# Patient Record
Sex: Male | Born: 1960 | Race: White | Hispanic: No | Marital: Married | Smoking: Current some day smoker
Health system: Southern US, Community
[De-identification: ages and names within clinical notes are randomized; demographics above are authoritative.]

## PROBLEM LIST (undated history)

## (undated) DIAGNOSIS — H5210 Myopia, unspecified eye: Secondary | ICD-10-CM

## (undated) DIAGNOSIS — H43811 Vitreous degeneration, right eye: Secondary | ICD-10-CM

## (undated) DIAGNOSIS — E669 Obesity, unspecified: Secondary | ICD-10-CM

## (undated) DIAGNOSIS — E785 Hyperlipidemia, unspecified: Secondary | ICD-10-CM

## (undated) DIAGNOSIS — N529 Male erectile dysfunction, unspecified: Secondary | ICD-10-CM

## (undated) DIAGNOSIS — K760 Fatty (change of) liver, not elsewhere classified: Secondary | ICD-10-CM

## (undated) DIAGNOSIS — M1990 Unspecified osteoarthritis, unspecified site: Secondary | ICD-10-CM

## (undated) DIAGNOSIS — H21261 Iris atrophy (essential) (progressive), right eye: Secondary | ICD-10-CM

## (undated) HISTORY — DX: Hyperlipidemia, unspecified: E78.5

## (undated) HISTORY — DX: Vitreous degeneration, right eye: H43.811

## (undated) HISTORY — PX: EYE SURGERY: SHX253

## (undated) HISTORY — DX: Gilbert syndrome: E80.4

## (undated) HISTORY — DX: Obesity, unspecified: E66.9

## (undated) HISTORY — DX: Male erectile dysfunction, unspecified: N52.9

## (undated) HISTORY — DX: Myopia, unspecified eye: H52.10

## (undated) HISTORY — PX: CARDIAC CATHETERIZATION: SHX172

## (undated) HISTORY — DX: Iris atrophy (essential) (progressive), right eye: H21.261

---

## 2006-06-16 ENCOUNTER — Ambulatory Visit: Payer: Self-pay

## 2010-11-02 HISTORY — PX: COLONOSCOPY W/ POLYPECTOMY: SHX1380

## 2013-09-19 ENCOUNTER — Ambulatory Visit: Payer: Self-pay | Admitting: Family Medicine

## 2014-08-08 ENCOUNTER — Ambulatory Visit: Payer: Self-pay | Admitting: Family Medicine

## 2014-09-07 ENCOUNTER — Ambulatory Visit: Payer: Self-pay | Admitting: Family Medicine

## 2014-10-15 ENCOUNTER — Ambulatory Visit: Payer: Self-pay | Admitting: Pain Medicine

## 2014-10-31 ENCOUNTER — Ambulatory Visit: Payer: Self-pay | Admitting: Pain Medicine

## 2014-11-27 ENCOUNTER — Ambulatory Visit: Payer: Self-pay | Admitting: Pain Medicine

## 2014-12-10 ENCOUNTER — Ambulatory Visit: Payer: Self-pay | Admitting: Pain Medicine

## 2015-04-24 ENCOUNTER — Other Ambulatory Visit: Payer: Self-pay | Admitting: Family Medicine

## 2015-05-21 ENCOUNTER — Ambulatory Visit: Payer: Self-pay | Admitting: Family Medicine

## 2015-06-04 DIAGNOSIS — E785 Hyperlipidemia, unspecified: Secondary | ICD-10-CM | POA: Insufficient documentation

## 2015-06-12 ENCOUNTER — Ambulatory Visit: Payer: Self-pay | Admitting: Family Medicine

## 2015-07-22 ENCOUNTER — Encounter: Payer: Self-pay | Admitting: Emergency Medicine

## 2015-07-22 ENCOUNTER — Emergency Department
Admission: EM | Admit: 2015-07-22 | Discharge: 2015-07-22 | Disposition: A | Discharge status: Home / Self Care | Payer: Self-pay | Attending: Emergency Medicine | Admitting: Emergency Medicine

## 2015-07-22 DIAGNOSIS — Z7982 Long term (current) use of aspirin: Secondary | ICD-10-CM | POA: Insufficient documentation

## 2015-07-22 DIAGNOSIS — Z87891 Personal history of nicotine dependence: Secondary | ICD-10-CM | POA: Insufficient documentation

## 2015-07-22 DIAGNOSIS — Z79899 Other long term (current) drug therapy: Secondary | ICD-10-CM | POA: Insufficient documentation

## 2015-07-22 DIAGNOSIS — Y9389 Activity, other specified: Secondary | ICD-10-CM | POA: Insufficient documentation

## 2015-07-22 DIAGNOSIS — Y92410 Unspecified street and highway as the place of occurrence of the external cause: Secondary | ICD-10-CM | POA: Insufficient documentation

## 2015-07-22 DIAGNOSIS — Y998 Other external cause status: Secondary | ICD-10-CM | POA: Insufficient documentation

## 2015-07-22 DIAGNOSIS — S46812A Strain of other muscles, fascia and tendons at shoulder and upper arm level, left arm, initial encounter: Secondary | ICD-10-CM | POA: Insufficient documentation

## 2015-07-22 MED ORDER — NAPROXEN 500 MG PO TABS: 500.0000 mg | ORAL_TABLET | Freq: Two times a day (BID) | ORAL | Status: DC

## 2015-07-22 NOTE — Discharge Instructions (Signed)
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
## 2015-09-08 ENCOUNTER — Other Ambulatory Visit: Payer: Self-pay | Admitting: Family Medicine

## 2015-09-09 NOTE — Telephone Encounter (Signed)
Apt pe 

## 2015-09-10 NOTE — Telephone Encounter (Signed)
LM for pt to call and schedule appt. °

## 2015-09-24 ENCOUNTER — Telehealth: Payer: Self-pay | Admitting: Vascular Surgery

## 2015-09-24 NOTE — Telephone Encounter (Signed)
Spoke with pt, he was very hesitant to schedule for 12/.21/16 because he did not have any time at work to take. However, I think I convinced him to schedule- he is supposed to call me back to confirm,d pm

## 2015-09-24 NOTE — Telephone Encounter (Signed)
-----   Message from Denman George, RN sent at 09/24/2015 12:47 PM EST ----- Regarding: needs consult with [facility] Contact: [phone] Please schedule for consult with Dr. Scot Dock prior to ALIF of 10/25/15 (Dr. Scot Dock is sched. to assist Dr. Hal Neer)  Please remind pt. to bring copy of LS spine film to appt.

## 2015-09-30 ENCOUNTER — Other Ambulatory Visit (HOSPITAL_COMMUNITY): Payer: Self-pay | Admitting: Neurosurgery

## 2015-10-02 ENCOUNTER — Other Ambulatory Visit: Payer: Self-pay

## 2015-10-03 ENCOUNTER — Other Ambulatory Visit: Payer: Self-pay

## 2015-10-16 ENCOUNTER — Telehealth: Payer: Self-pay

## 2015-10-16 MED ORDER — TRAZODONE HCL 50 MG PO TABS: ORAL_TABLET | ORAL | Status: DC

## 2015-10-16 NOTE — Telephone Encounter (Signed)
[facility] Refill request for 90 day supply on the following: Trazodone 50mg  1 tablet at bedtime as needed

## 2015-10-18 ENCOUNTER — Encounter: Payer: Self-pay | Admitting: Vascular Surgery

## 2015-10-23 ENCOUNTER — Encounter: Payer: Self-pay | Admitting: Vascular Surgery

## 2015-10-23 ENCOUNTER — Other Ambulatory Visit: Payer: Self-pay | Admitting: Family Medicine

## 2015-10-24 NOTE — Telephone Encounter (Signed)
Apt pe 

## 2015-11-18 ENCOUNTER — Encounter: Payer: Self-pay | Admitting: Family Medicine

## 2015-12-19 ENCOUNTER — Encounter (HOSPITAL_COMMUNITY): Admission: RE | Payer: Self-pay | Source: Ambulatory Visit

## 2015-12-19 ENCOUNTER — Inpatient Hospital Stay (HOSPITAL_COMMUNITY)
Admission: RE | Admit: 2015-12-19 | Payer: No Typology Code available for payment source | Source: Ambulatory Visit | Admitting: Neurosurgery

## 2015-12-19 SURGERY — ANTERIOR LUMBAR FUSION 1 LEVEL
Anesthesia: General

## 2016-01-19 ENCOUNTER — Other Ambulatory Visit: Payer: Self-pay | Admitting: Family Medicine

## 2016-01-22 ENCOUNTER — Telehealth: Payer: Self-pay | Admitting: Family Medicine

## 2016-01-22 MED ORDER — TRAZODONE HCL 50 MG PO TABS: ORAL_TABLET | ORAL | Status: DC

## 2016-01-22 MED ORDER — MELOXICAM 15 MG PO TABS: 15.0000 mg | ORAL_TABLET | Freq: Every day | ORAL | Status: DC

## 2016-01-22 MED ORDER — ATORVASTATIN CALCIUM 10 MG PO TABS: 10.0000 mg | ORAL_TABLET | Freq: Every day | ORAL | Status: DC

## 2016-01-22 NOTE — Telephone Encounter (Signed)
Rxs sent to his pharmacy.  

## 2016-01-22 NOTE — Telephone Encounter (Signed)
Pt called and scheduled a CPE for 03/03/16, Dr. Rance Muir first available, pt will needs enough refills on all medication to last until his CPE appt 03/03/16 @ 2pm. Thanks.

## 2016-03-03 ENCOUNTER — Other Ambulatory Visit: Payer: Self-pay | Admitting: Family Medicine

## 2016-03-03 ENCOUNTER — Ambulatory Visit (INDEPENDENT_AMBULATORY_CARE_PROVIDER_SITE_OTHER): Payer: BLUE CROSS/BLUE SHIELD | Admitting: Family Medicine

## 2016-03-03 ENCOUNTER — Encounter: Payer: Self-pay | Admitting: Family Medicine

## 2016-03-03 VITALS — BP 119/78 | HR 60 | Temp 97.8°F | Ht 70.0 in | Wt 255.0 lb

## 2016-03-03 DIAGNOSIS — R5382 Chronic fatigue, unspecified: Secondary | ICD-10-CM

## 2016-03-03 DIAGNOSIS — Z23 Encounter for immunization: Secondary | ICD-10-CM

## 2016-03-03 DIAGNOSIS — E785 Hyperlipidemia, unspecified: Secondary | ICD-10-CM

## 2016-03-03 DIAGNOSIS — M51369 Other intervertebral disc degeneration, lumbar region without mention of lumbar back pain or lower extremity pain: Secondary | ICD-10-CM | POA: Insufficient documentation

## 2016-03-03 DIAGNOSIS — Z0000 Encounter for general adult medical examination without abnormal findings: Secondary | ICD-10-CM | POA: Diagnosis not present

## 2016-03-03 DIAGNOSIS — M5136 Other intervertebral disc degeneration, lumbar region: Secondary | ICD-10-CM | POA: Diagnosis not present

## 2016-03-03 LAB — URINALYSIS, ROUTINE W REFLEX MICROSCOPIC
BILIRUBIN UA: NEGATIVE
Glucose, UA: NEGATIVE
Ketones, UA: NEGATIVE
LEUKOCYTES UA: NEGATIVE
Nitrite, UA: NEGATIVE
PH UA: 5 (ref 5.0–7.5)
Protein, UA: NEGATIVE
Specific Gravity, UA: 1.015 (ref 1.005–1.030)
Urobilinogen, Ur: 0.2 mg/dL (ref 0.2–1.0)

## 2016-03-03 LAB — MICROSCOPIC EXAMINATION
BACTERIA UA: NONE SEEN
EPITHELIAL CELLS (NON RENAL): NONE SEEN /HPF (ref 0–10)
RBC, UA: NONE SEEN /hpf (ref 0–?)
WBC, UA: NONE SEEN /hpf (ref 0–?)

## 2016-03-03 MED ORDER — ATORVASTATIN CALCIUM 10 MG PO TABS: 10.0000 mg | ORAL_TABLET | Freq: Every day | ORAL | Status: DC

## 2016-03-03 MED ORDER — MELOXICAM 15 MG PO TABS: 15.0000 mg | ORAL_TABLET | Freq: Every day | ORAL | Status: DC

## 2016-03-03 MED ORDER — TRAZODONE HCL 50 MG PO TABS: ORAL_TABLET | ORAL | Status: DC

## 2016-03-03 NOTE — Assessment & Plan Note (Signed)
The current medical regimen is effective;  continue present plan and medications.  

## 2016-03-03 NOTE — Progress Notes (Signed)
BP 119/78 mmHg  Pulse 60  Temp(Src) 97.8 F (36.6 C)  Ht 5\' 10"  (1.778 m)  Wt 255 lb (115.667 kg)  BMI 36.59 kg/m2  SpO2 95%   Subjective:    Patient ID: Willie Williams, male    DOB: 05-04-1961, 55 y.o.   MRN: [id number]  HPI: Willie Williams is a 55 y.o. male  Chief Complaint  Patient presents with  . Annual Exam  Patient doing well no complaints from medication No complaints neck stable what helped his back most his been physical therapy Eyes have  been stable.  with some fatigue issues concerned about testosterone will check that today Sleep trazodone seems to help sleep okay A more difficult sleep schedule Has also arthritis complaints especially left hand elbow shoulder has occasional meloxicam which seems to help  Relevant past medical, surgical, family and social history reviewed and updated as indicated. Interim medical history since our last visit reviewed. Allergies and medications reviewed and updated.  Review of Systems  Constitutional: Negative.   HENT: Negative.   Eyes: Negative.   Respiratory: Negative.   Cardiovascular: Negative.   Gastrointestinal: Negative.   Endocrine: Negative.   Genitourinary: Negative.   Musculoskeletal: Negative.   Skin: Negative.   Allergic/Immunologic: Negative.   Neurological: Negative.   Hematological: Negative.   Psychiatric/Behavioral: Negative.     Per HPI unless specifically indicated above     Objective:    BP 119/78 mmHg  Pulse 60  Temp(Src) 97.8 F (36.6 C)  Ht 5\' 10"  (1.778 m)  Wt 255 lb (115.667 kg)  BMI 36.59 kg/m2  SpO2 95%  Wt Readings from Last 3 Encounters:  03/03/16 255 lb (115.667 kg)  07/22/15 241 lb (109.317 kg)  11/13/14 244 lb (110.678 kg)    Physical Exam  Constitutional: He is oriented to person, place, and time. He appears well-developed and well-nourished.  HENT:  Head: Normocephalic and atraumatic.  Right Ear: External ear normal.  Left Ear: External ear normal.  Eyes:  Conjunctivae and EOM are normal. Pupils are equal, round, and reactive to light.  Neck: Normal range of motion. Neck supple.  Cardiovascular: Normal rate, regular rhythm, normal heart sounds and intact distal pulses.   Pulmonary/Chest: Effort normal and breath sounds normal.  Abdominal: Soft. Bowel sounds are normal. There is no splenomegaly or hepatomegaly.  Genitourinary: Rectum normal, prostate normal and penis normal.  Musculoskeletal: Normal range of motion.  Neurological: He is alert and oriented to person, place, and time. He has normal reflexes.  Skin: No rash noted. No erythema.  Psychiatric: He has a normal mood and affect. His behavior is normal. Judgment and thought content normal.    Results for orders placed or performed in visit on 03/03/16  Microscopic Examination  Result Value Ref Range   WBC, UA None seen 0 -  5 /hpf   RBC, UA None seen 0 -  2 /hpf   Epithelial Cells (non renal) None seen 0 - 10 /hpf   Bacteria, UA None seen None seen/Few  Urinalysis, Routine w reflex microscopic (not at [facility])  Result Value Ref Range   Specific Gravity, UA 1.015 1.005 - 1.030   pH, UA 5.0 5.0 - 7.5   Color, UA Yellow Yellow   Appearance Ur Clear Clear   Leukocytes, UA Negative Negative   Protein, UA Negative Negative/Trace   Glucose, UA Negative Negative   Ketones, UA Negative Negative   RBC, UA Trace (A) Negative   Bilirubin, UA  Negative Negative   Urobilinogen, Ur 0.2 0.2 - 1.0 mg/dL   Nitrite, UA Negative Negative   Microscopic Examination See below:       Assessment & Plan:   Problem List Items Addressed This Visit    None    Visit Diagnoses    Routine general medical examination at a health care facility    -  Primary    Relevant Orders    CBC with Differential/Platelet    Comprehensive metabolic panel    Lipid Panel w/o Chol/HDL Ratio    PSA    TSH    Urinalysis, Routine w reflex microscopic (not at [facility]) (Completed)    Healthcare maintenance        Relevant  Orders    Hepatitis C Antibody    Immunization due        Relevant Orders    Tdap vaccine greater than or equal to 7yo IM (Completed)        Follow up plan: No Follow-up on file.

## 2016-03-03 NOTE — Assessment & Plan Note (Signed)
Stable

## 2016-03-04 ENCOUNTER — Telehealth: Payer: Self-pay | Admitting: Family Medicine

## 2016-03-04 DIAGNOSIS — R7989 Other specified abnormal findings of blood chemistry: Secondary | ICD-10-CM

## 2016-03-04 LAB — CBC WITH DIFFERENTIAL/PLATELET
BASOS ABS: 0.1 10*3/uL (ref 0.0–0.2)
BASOS: 1 %
EOS (ABSOLUTE): 0.2 10*3/uL (ref 0.0–0.4)
Eos: 2 %
Hematocrit: 46.1 % (ref 37.5–51.0)
Hemoglobin: 15.5 g/dL (ref 12.6–17.7)
IMMATURE GRANS (ABS): 0 10*3/uL (ref 0.0–0.1)
IMMATURE GRANULOCYTES: 0 %
Lymphocytes Absolute: 3.6 10*3/uL — ABNORMAL HIGH (ref 0.7–3.1)
Lymphs: 51 %
MCH: 30.1 pg (ref 26.6–33.0)
MCHC: 33.6 g/dL (ref 31.5–35.7)
MCV: 90 fL (ref 79–97)
MONOS ABS: 0.6 10*3/uL (ref 0.1–0.9)
Monocytes: 9 %
Neutrophils Absolute: 2.6 10*3/uL (ref 1.4–7.0)
Neutrophils: 37 %
PLATELETS: 191 10*3/uL (ref 150–379)
RBC: 5.15 x10E6/uL (ref 4.14–5.80)
RDW: 13.8 % (ref 12.3–15.4)
WBC: 7.1 10*3/uL (ref 3.4–10.8)

## 2016-03-04 LAB — COMPREHENSIVE METABOLIC PANEL
A/G RATIO: 2.2 (ref 1.2–2.2)
ALT: 45 IU/L — AB (ref 0–44)
AST: 28 IU/L (ref 0–40)
Albumin: 4.6 g/dL (ref 3.5–5.5)
Alkaline Phosphatase: 56 IU/L (ref 39–117)
BUN/Creatinine Ratio: 13 (ref 9–20)
BUN: 12 mg/dL (ref 6–24)
Bilirubin Total: 0.9 mg/dL (ref 0.0–1.2)
CALCIUM: 9.3 mg/dL (ref 8.7–10.2)
CHLORIDE: 100 mmol/L (ref 96–106)
CO2: 21 mmol/L (ref 18–29)
Creatinine, Ser: 0.91 mg/dL (ref 0.76–1.27)
GFR, EST AFRICAN AMERICAN: 109 mL/min/{1.73_m2} (ref >59)
GFR, EST NON AFRICAN AMERICAN: 95 mL/min/{1.73_m2} (ref >59)
GLUCOSE: 83 mg/dL (ref 65–99)
Globulin, Total: 2.1 g/dL (ref 1.5–4.5)
POTASSIUM: 4.3 mmol/L (ref 3.5–5.2)
Sodium: 138 mmol/L (ref 134–144)
TOTAL PROTEIN: 6.7 g/dL (ref 6.0–8.5)

## 2016-03-04 LAB — TESTOSTERONE: Testosterone: 286 ng/dL — ABNORMAL LOW (ref 348–1197)

## 2016-03-04 LAB — LIPID PANEL W/O CHOL/HDL RATIO
Cholesterol, Total: 180 mg/dL (ref 100–199)
HDL: 29 mg/dL — AB (ref >39)
LDL Calculated: 76 mg/dL (ref 0–99)
TRIGLYCERIDES: 373 mg/dL — AB (ref 0–149)
VLDL CHOLESTEROL CAL: 75 mg/dL — AB (ref 5–40)

## 2016-03-04 LAB — PSA: PROSTATE SPECIFIC AG, SERUM: 0.7 ng/mL (ref 0.0–4.0)

## 2016-03-04 LAB — TSH: TSH: 3.56 u[IU]/mL (ref 0.450–4.500)

## 2016-03-04 LAB — HEPATITIS C ANTIBODY: Hep C Virus Ab: <0.1 s/co ratio (ref 0.0–0.9)

## 2016-03-04 NOTE — Telephone Encounter (Signed)
Phone call Discussed with patient low testosterone. Discuss future workup and possible options if testosterone is in fact low. We will recheck testosterone

## 2016-07-27 DIAGNOSIS — S59902A Unspecified injury of left elbow, initial encounter: Secondary | ICD-10-CM | POA: Diagnosis not present

## 2016-07-27 DIAGNOSIS — S53402A Unspecified sprain of left elbow, initial encounter: Secondary | ICD-10-CM | POA: Diagnosis not present

## 2016-09-07 ENCOUNTER — Encounter: Payer: Self-pay | Admitting: Family Medicine

## 2016-09-07 ENCOUNTER — Other Ambulatory Visit: Payer: Self-pay | Admitting: Family Medicine

## 2016-09-07 ENCOUNTER — Ambulatory Visit (INDEPENDENT_AMBULATORY_CARE_PROVIDER_SITE_OTHER): Payer: BLUE CROSS/BLUE SHIELD | Admitting: Family Medicine

## 2016-09-07 VITALS — BP 145/91 | HR 67 | Temp 97.7°F | Ht 69.6 in | Wt 253.9 lb

## 2016-09-07 DIAGNOSIS — S46212A Strain of muscle, fascia and tendon of other parts of biceps, left arm, initial encounter: Secondary | ICD-10-CM | POA: Insufficient documentation

## 2016-09-07 DIAGNOSIS — L989 Disorder of the skin and subcutaneous tissue, unspecified: Secondary | ICD-10-CM

## 2016-09-07 DIAGNOSIS — Z1211 Encounter for screening for malignant neoplasm of colon: Secondary | ICD-10-CM | POA: Diagnosis not present

## 2016-09-07 DIAGNOSIS — E782 Mixed hyperlipidemia: Secondary | ICD-10-CM | POA: Diagnosis not present

## 2016-09-07 DIAGNOSIS — L859 Epidermal thickening, unspecified: Secondary | ICD-10-CM | POA: Diagnosis not present

## 2016-09-07 DIAGNOSIS — Z23 Encounter for immunization: Secondary | ICD-10-CM

## 2016-09-07 LAB — LP+ALT+AST PICCOLO, WAIVED
ALT (SGPT) PICCOLO, WAIVED: 34 U/L (ref 10–47)
AST (SGOT) PICCOLO, WAIVED: 33 U/L (ref 11–38)
CHOL/HDL RATIO PICCOLO,WAIVE: 3.5 mg/dL
CHOLESTEROL PICCOLO, WAIVED: 131 mg/dL (ref <200)
HDL Chol Piccolo, Waived: 38 mg/dL — ABNORMAL LOW (ref >59)
LDL Chol Calc Piccolo Waived: 52 mg/dL (ref <100)
TRIGLYCERIDES PICCOLO,WAIVED: 206 mg/dL — AB (ref <150)
VLDL Chol Calc Piccolo,Waive: 41 mg/dL — ABNORMAL HIGH (ref <30)

## 2016-09-07 NOTE — Addendum Note (Signed)
Addended byGolden Pop on: 09/07/2016 03:09 PM   Modules accepted: Orders

## 2016-09-07 NOTE — Assessment & Plan Note (Signed)
Discussed partial rupture left biceps tendon patient not ready to go to orthopedics yet discuss possible partial healing but will have chronic weakness of the left biceps. His biceps are not Job critical.

## 2016-09-07 NOTE — Assessment & Plan Note (Signed)
Shave biopsy performed see physical for details

## 2016-09-07 NOTE — Assessment & Plan Note (Signed)
The current medical regimen is effective;  continue present plan and medications.  

## 2016-09-07 NOTE — Patient Instructions (Addendum)
Influenza (Flu) Vaccine (Inactivated or Recombinant):  1. Why get vaccinated? Influenza ("flu") is a contagious disease that spreads around the [place] every year, usually between October and May. Flu is caused by influenza viruses, and is spread mainly by coughing, sneezing, and close contact. Anyone can get flu. Flu strikes suddenly and can last several days. Symptoms vary by age, but can include:  fever/chills  sore throat  muscle aches  fatigue  cough  headache  runny or stuffy nose Flu can also lead to pneumonia and blood infections, and cause diarrhea and seizures in children. If you have a medical condition, such as heart or lung disease, flu can make it worse. Flu is more dangerous for some people. Infants and young children, people 65 years of age and older, pregnant women, and people with certain health conditions or a weakened immune system are at greatest risk. Each year thousands of people in the [place] die from flu, and many more are hospitalized. Flu vaccine can:  keep you from getting flu,  make flu less severe if you do get it, and  keep you from spreading flu to your family and other people. 2. Inactivated and recombinant flu vaccines A dose of flu vaccine is recommended every flu season. Children 6 months through 8 years of age may need two doses during the same flu season. Everyone else needs only one dose each flu season. Some inactivated flu vaccines contain a very small amount of a mercury-based preservative called thimerosal. Studies have not shown thimerosal in vaccines to be harmful, but flu vaccines that do not contain thimerosal are available. There is no live flu virus in flu shots. They cannot cause the flu. There are many flu viruses, and they are always changing. Each year a new flu vaccine is made to protect against three or four viruses that are likely to cause disease in the upcoming flu season. But even when the vaccine doesn't exactly  match these viruses, it may still provide some protection. Flu vaccine cannot prevent:  flu that is caused by a virus not covered by the vaccine, or  illnesses that look like flu but are not. It takes about 2 weeks for protection to develop after vaccination, and protection lasts through the flu season. 3. Some people should not get this vaccine Tell the person who is giving you the vaccine:  If you have any severe, life-threatening allergies. If you ever had a life-threatening allergic reaction after a dose of flu vaccine, or have a severe allergy to any part of this vaccine, you may be advised not to get vaccinated. Most, but not all, types of flu vaccine contain a small amount of egg protein.  If you ever had Guillain-Barre Syndrome (also called GBS). Some people with a history of GBS should not get this vaccine. This should be discussed with your doctor.  If you are not feeling well. It is usually okay to get flu vaccine when you have a mild illness, but you might be asked to come back when you feel better. 4. Risks of a vaccine reaction With any medicine, including vaccines, there is a chance of reactions. These are usually mild and go away on their own, but serious reactions are also possible. Most people who get a flu shot do not have any problems with it. Minor problems following a flu shot include:  soreness, redness, or swelling where the shot was given  hoarseness  sore, red or itchy eyes  cough    fever  aches  headache  itching  fatigue If these problems occur, they usually begin soon after the shot and last 1 or 2 days. More serious problems following a flu shot can include the following:  There may be a small increased risk of Guillain-Barre Syndrome (GBS) after inactivated flu vaccine. This risk has been estimated at 1 or 2 additional cases per million people vaccinated. This is much lower than the risk of severe complications from flu, which can be prevented by  flu vaccine.  Young children who get the flu shot along with pneumococcal vaccine (PCV13) and/or DTaP vaccine at the same time might be slightly more likely to have a seizure caused by fever. Ask your doctor for more information. Tell your doctor if a child who is getting flu vaccine has ever had a seizure. Problems that could happen after any injected vaccine:  People sometimes faint after a medical procedure, including vaccination. Sitting or lying down for about 15 minutes can help prevent fainting, and injuries caused by a fall. Tell your doctor if you feel dizzy, or have vision changes or ringing in the ears.  Some people get severe pain in the shoulder and have difficulty moving the arm where a shot was given. This happens very rarely.  Any medication can cause a severe allergic reaction. Such reactions from a vaccine are very rare, estimated at about 1 in a million doses, and would happen within a few minutes to a few hours after the vaccination. As with any medicine, there is a very remote chance of a vaccine causing a serious injury or death. The safety of vaccines is always being monitored. For more information, visit: www.cdc.gov/vaccinesafety/ 5. What if there is a serious reaction? What should I look for?  Look for anything that concerns you, such as signs of a severe allergic reaction, very high fever, or unusual behavior. Signs of a severe allergic reaction can include hives, swelling of the face and throat, difficulty breathing, a fast heartbeat, dizziness, and weakness. These would start a few minutes to a few hours after the vaccination. What should I do?  If you think it is a severe allergic reaction or other emergency that can't wait, call 9-1-1 and get the person to the nearest hospital. Otherwise, call your doctor.  Reactions should be reported to the Vaccine Adverse Event Reporting System (VAERS). Your doctor should file this report, or you can do it yourself through the  VAERS web site at www.vaers.hhs.gov, or by calling 1-800-822-7967. VAERS does not give medical advice. 6. The National Vaccine Injury Compensation Program The National Vaccine Injury Compensation Program (VICP) is a federal program that was created to compensate people who may have been injured by certain vaccines. Persons who believe they may have been injured by a vaccine can learn about the program and about filing a claim by calling 1-800-338-2382 or visiting the VICP website at www.hrsa.gov/vaccinecompensation. There is a time limit to file a claim for compensation. 7. How can I learn more?  Ask your healthcare provider. He or she can give you the vaccine package insert or suggest other sources of information.  Call your local or state health department.  Contact the Centers for Disease Control and Prevention (CDC):  Call 1-800-232-4636 (1-800-CDC-INFO) or  Visit CDC's website at www.cdc.gov/flu Vaccine Information Statement Inactivated Influenza Vaccine (06/08/2014)   This information is not intended to replace advice given to you by your health care provider. Make sure you discuss any questions you have with   your health care provider.   Document Released: 08/13/2006 Document Revised: 11/09/2014 Document Reviewed: 06/11/2014 Elsevier Interactive Patient Education 2016 Elsevier Inc. Influenza (Flu) Vaccine (Inactivated or Recombinant):  1. Why get vaccinated? Influenza ("flu") is a contagious disease that spreads around the [place] every year, usually between October and May. Flu is caused by influenza viruses, and is spread mainly by coughing, sneezing, and close contact. Anyone can get flu. Flu strikes suddenly and can last several days. Symptoms vary by age, but can include:  fever/chills  sore throat  muscle aches  fatigue  cough  headache  runny or stuffy nose Flu can also lead to pneumonia and blood infections, and cause diarrhea and seizures in children. If  you have a medical condition, such as heart or lung disease, flu can make it worse. Flu is more dangerous for some people. Infants and young children, people 56 years of age and older, pregnant women, and people with certain health conditions or a weakened immune system are at greatest risk. Each year thousands of people in the [place] die from flu, and many more are hospitalized. Flu vaccine can:  keep you from getting flu,  make flu less severe if you do get it, and  keep you from spreading flu to your family and other people. 2. Inactivated and recombinant flu vaccines A dose of flu vaccine is recommended every flu season. Children 6 months through 34 years of age may need two doses during the same flu season. Everyone else needs only one dose each flu season. Some inactivated flu vaccines contain a very small amount of a mercury-based preservative called thimerosal. Studies have not shown thimerosal in vaccines to be harmful, but flu vaccines that do not contain thimerosal are available. There is no live flu virus in flu shots. They cannot cause the flu. There are many flu viruses, and they are always changing. Each year a new flu vaccine is made to protect against three or four viruses that are likely to cause disease in the upcoming flu season. But even when the vaccine doesn't exactly match these viruses, it may still provide some protection. Flu vaccine cannot prevent:  flu that is caused by a virus not covered by the vaccine, or  illnesses that look like flu but are not. It takes about 2 weeks for protection to develop after vaccination, and protection lasts through the flu season. 3. Some people should not get this vaccine Tell the person who is giving you the vaccine:  If you have any severe, life-threatening allergies. If you ever had a life-threatening allergic reaction after a dose of flu vaccine, or have a severe allergy to any part of this vaccine, you may be advised not  to get vaccinated. Most, but not all, types of flu vaccine contain a small amount of egg protein.  If you ever had Guillain-Barre Syndrome (also called GBS). Some people with a history of GBS should not get this vaccine. This should be discussed with your doctor.  If you are not feeling well. It is usually okay to get flu vaccine when you have a mild illness, but you might be asked to come back when you feel better. 4. Risks of a vaccine reaction With any medicine, including vaccines, there is a chance of reactions. These are usually mild and go away on their own, but serious reactions are also possible. Most people who get a flu shot do not have any problems with it. Minor problems following a flu  shot include:  soreness, redness, or swelling where the shot was given  hoarseness  sore, red or itchy eyes  cough  fever  aches  headache  itching  fatigue If these problems occur, they usually begin soon after the shot and last 1 or 2 days. More serious problems following a flu shot can include the following:  There may be a small increased risk of Guillain-Barre Syndrome (GBS) after inactivated flu vaccine. This risk has been estimated at 1 or 2 additional cases per million people vaccinated. This is much lower than the risk of severe complications from flu, which can be prevented by flu vaccine.  Young children who get the flu shot along with pneumococcal vaccine (PCV13) and/or DTaP vaccine at the same time might be slightly more likely to have a seizure caused by fever. Ask your doctor for more information. Tell your doctor if a child who is getting flu vaccine has ever had a seizure. Problems that could happen after any injected vaccine:  People sometimes faint after a medical procedure, including vaccination. Sitting or lying down for about 15 minutes can help prevent fainting, and injuries caused by a fall. Tell your doctor if you feel dizzy, or have vision changes or ringing in  the ears.  Some people get severe pain in the shoulder and have difficulty moving the arm where a shot was given. This happens very rarely.  Any medication can cause a severe allergic reaction. Such reactions from a vaccine are very rare, estimated at about 1 in a million doses, and would happen within a few minutes to a few hours after the vaccination. As with any medicine, there is a very remote chance of a vaccine causing a serious injury or death. The safety of vaccines is always being monitored. For more information, visit: http://www.aguilar.org/ 5. What if there is a serious reaction? What should I look for?  Look for anything that concerns you, such as signs of a severe allergic reaction, very high fever, or unusual behavior. Signs of a severe allergic reaction can include hives, swelling of the face and throat, difficulty breathing, a fast heartbeat, dizziness, and weakness. These would start a few minutes to a few hours after the vaccination. What should I do?  If you think it is a severe allergic reaction or other emergency that can't wait, call 9-1-1 and get the person to the nearest hospital. Otherwise, call your doctor.  Reactions should be reported to the Vaccine Adverse Event Reporting System (VAERS). Your doctor should file this report, or you can do it yourself through the VAERS web site at www.vaers.SamedayNews.es, or by calling (289) 449-0081. VAERS does not give medical advice. 6. The National Vaccine Injury Compensation Program The [employer] Vaccine Injury Compensation Program (VICP) is a federal program that was created to compensate people who may have been injured by certain vaccines. Persons who believe they may have been injured by a vaccine can learn about the program and about filing a claim by calling 581 044 4848 or visiting the Miguel [employer]rera website at GoldCloset.com.ee. There is a time limit to file a claim for compensation. 7. How can I learn more?  Ask  your healthcare provider. He or she can give you the vaccine package insert or suggest other sources of information.  Call your local or state health department.  Contact the Centers for Disease Control and Prevention (CDC):  Call (534)664-8146 (1-800-CDC-INFO) or  Visit CDC's website at https://gibson.com/ Vaccine Information Statement Inactivated Influenza Vaccine (06/08/2014)   This information  is not intended to replace advice given to you by your health care provider. Make sure you discuss any questions you have with your health care provider.   Document Released: 08/13/2006 Document Revised: 11/09/2014 Document Reviewed: 06/11/2014 Elsevier Interactive Patient Education [employer].

## 2016-09-07 NOTE — Progress Notes (Signed)
BP (!) 145/91 (BP Location: Left Arm, Patient Position: Sitting, Cuff Size: Large)   Pulse 67   Temp 97.7 F (36.5 C)   Ht 5' 9.6" (1.768 m)   Wt 253 lb 14.4 oz (115.2 kg)   SpO2 96%   BMI 36.85 kg/m    Subjective:    Patient ID: Willie Williams, male    DOB: Oct 03, 1961, 55 y.o.   MRN: [id number]  HPI: Willie Williams is a 55 y.o. male  Chief Complaint  Patient presents with  . Hyperlipidemia  With several concerns taking Lipitor with no problems taking faithfully Has nonhealing skin lesion right temple's been present pretty much all year will scratching bleed occasionally and is not healing.  On September 19 patient was picking up the front end of the boat and felt the biceps tendon pop with sudden onset of pain and no bruising had really limited use of his hands has noticed his biceps muscle has changed shape. Continues to be painful with use of his biceps Relevant past medical, surgical, family and social history reviewed and updated as indicated. Interim medical history since our last visit reviewed. Allergies and medications reviewed and updated.  Review of Systems  Constitutional: Negative.   Respiratory: Negative.   Cardiovascular: Negative.     Per HPI unless specifically indicated above     Objective:    BP (!) 145/91 (BP Location: Left Arm, Patient Position: Sitting, Cuff Size: Large)   Pulse 67   Temp 97.7 F (36.5 C)   Ht 5' 9.6" (1.768 m)   Wt 253 lb 14.4 oz (115.2 kg)   SpO2 96%   BMI 36.85 kg/m   Wt Readings from Last 3 Encounters:  09/07/16 253 lb 14.4 oz (115.2 kg)  03/03/16 255 lb (115.7 kg)  07/22/15 241 lb (109.3 kg)    Physical Exam  Constitutional: He is oriented to person, place, and time. He appears well-developed and well-nourished. No distress.  HENT:  Head: Normocephalic and atraumatic.  Right Ear: Hearing normal.  Left Ear: Hearing normal.  Nose: Nose normal.  Eyes: Conjunctivae and lids are normal. Right eye exhibits no  discharge. Left eye exhibits no discharge. No scleral icterus.  Cardiovascular: Normal rate, regular rhythm and normal heart sounds.   Pulmonary/Chest: Effort normal and breath sounds normal. No respiratory distress.  Musculoskeletal: Normal range of motion.  Left biceps distal insertion medially with pain and largely absent with bunching up of lateral and medial head of biceps with flexion. No pain with supination.   Neurological: He is alert and oriented to person, place, and time.  Skin: Skin is intact. No rash noted.  Lesion on right temple 4 mm prepped with Betadine and alcohol and infiltrated with Xylocaine with epinephrine shave biopsy performed lesion sent for pathology base destroyed with Surgitron unit. Patient tolerated procedure well. Patient education given on wound care.  Psychiatric: He has a normal mood and affect. His speech is normal and behavior is normal. Judgment and thought content normal. Cognition and memory are normal.    Results for orders placed or performed in visit on 09/07/16  LP+ALT+AST Piccolo, [employer]  Result Value Ref Range   ALT (SGPT) Piccolo, Waived 34 10 - 47 U/L   AST (SGOT) Piccolo, Waived 33 11 - 38 U/L   Cholesterol Piccolo, Waived 131 <200 mg/dL   HDL Chol Piccolo, Waived 38 (L) >59 mg/dL   Triglycerides Piccolo,Waived 206 (H) <150 mg/dL   Chol/HDL Ratio Piccolo,Waive 3.5 mg/dL  LDL Chol Calc Piccolo Waived 52 <100 mg/dL   VLDL Chol Calc Piccolo,Waive 41 (H) <30 mg/dL      Assessment & Plan:   Problem List Items Addressed This Visit      Musculoskeletal and Integument   Biceps tendon rupture, left, initial encounter    Discussed partial rupture left biceps tendon patient not ready to go to orthopedics yet discuss possible partial healing but will have chronic weakness of the left biceps. His biceps are not Job critical.       Skin lesion of face    Shave biopsy performed see physical for details        Other   Hyperlipidemia -  Primary    The current medical regimen is effective;  continue present plan and medications.       Relevant Orders   LP+ALT+AST Piccolo, [employer] (Completed)   Basic metabolic panel   Gilbert's disease   Relevant Orders   LP+ALT+AST Piccolo, [employer] (Completed)   Basic metabolic panel    Other Visit Diagnoses    Immunization due       Relevant Orders   Flu Vaccine QUAD 36+ mos PF IM (Fluarix & Fluzone Quad PF) (Completed)   Screening for colon cancer       Relevant Orders   Ambulatory referral to General Surgery       Follow up plan: Return for Physical Exam.

## 2016-09-08 ENCOUNTER — Encounter: Payer: Self-pay | Admitting: Family Medicine

## 2016-09-08 LAB — BASIC METABOLIC PANEL
BUN / CREAT RATIO: 11 (ref 9–20)
BUN: 11 mg/dL (ref 6–24)
CO2: 22 mmol/L (ref 18–29)
Calcium: 9.1 mg/dL (ref 8.7–10.2)
Chloride: 105 mmol/L (ref 96–106)
Creatinine, Ser: 1 mg/dL (ref 0.76–1.27)
GFR calc Af Amer: 97 mL/min/{1.73_m2} (ref >59)
GFR, EST NON AFRICAN AMERICAN: 84 mL/min/{1.73_m2} (ref >59)
GLUCOSE: 93 mg/dL (ref 65–99)
POTASSIUM: 4.1 mmol/L (ref 3.5–5.2)
SODIUM: 143 mmol/L (ref 134–144)

## 2016-09-09 LAB — PATHOLOGY

## 2016-09-10 ENCOUNTER — Ambulatory Visit: Payer: BLUE CROSS/BLUE SHIELD | Admitting: Family Medicine

## 2016-09-16 ENCOUNTER — Telehealth: Payer: Self-pay

## 2016-09-16 ENCOUNTER — Other Ambulatory Visit: Payer: Self-pay

## 2016-09-16 NOTE — Telephone Encounter (Signed)
Gastroenterology Pre-Procedure Review  Request Date: 10/16/2016 Requesting Physician: Dr. Jeananne Rama  PATIENT REVIEW QUESTIONS: The patient responded to the following health history questions as indicated:    1. Are you having any GI issues? no 2. Do you have a personal history of Polyps? yes (benign ) 3. Do you have a family history of Colon Cancer or Polyps? no 4. Diabetes Mellitus? no 5. Joint replacements in the past 12 months?no 6. Major health problems in the past 3 months?no 7. Any artificial heart valves, MVP, or defibrillator?no    MEDICATIONS & ALLERGIES:    Patient reports the following regarding taking any anticoagulation/antiplatelet therapy:   Plavix, Coumadin, Eliquis, Xarelto, Lovenox, Pradaxa, Brilinta, or Effient? no Aspirin? yes (Heart [facility])  Patient confirms/reports the following medications:  Current Outpatient Prescriptions  Medication Sig Dispense Refill  . aspirin EC 325 MG tablet Take 325 mg by mouth daily.    Marland Kitchen atorvastatin (LIPITOR) 10 MG tablet Take 1 tablet (10 mg total) by mouth at bedtime. 90 tablet 4  . meloxicam (MOBIC) 15 MG tablet Take 1 tablet (15 mg total) by mouth daily. 90 tablet 3  . Multiple Vitamins tablet Take by mouth.    . Omega-3 Fatty Acids (FISH OIL BURP-LESS PO) Take by mouth.    . traZODone (DESYREL) 50 MG tablet 1 TABLET AT BEDTIME AS NEEDED ORAL FOR SLEEP 90 tablet 4   No current facility-administered medications for this visit.     Patient confirms/reports the following allergies:  No Known Allergies  No orders of the defined types were placed in this encounter.   AUTHORIZATION INFORMATION Primary Insurance: 1D#: Group #:  Secondary Insurance: 1D#: Group #:  SCHEDULE INFORMATION: Date: 10/16/2016 Time: Location: [facility]

## 2016-09-16 NOTE — Telephone Encounter (Signed)
Screening Colonoscopy Z12.11 [facility] 10/16/2016 Dr. Alvina Filbert Pre cert is not required

## 2016-10-16 ENCOUNTER — Ambulatory Visit: Payer: BLUE CROSS/BLUE SHIELD | Admitting: Certified Registered Nurse Anesthetist

## 2016-10-16 ENCOUNTER — Encounter: Payer: Self-pay | Admitting: *Deleted

## 2016-10-16 ENCOUNTER — Encounter
Admission: RE | Disposition: A | Discharge status: Home / Self Care | Payer: Self-pay | Source: Ambulatory Visit | Attending: Gastroenterology

## 2016-10-16 ENCOUNTER — Ambulatory Visit
Admission: RE | Admit: 2016-10-16 | Discharge: 2016-10-16 | Disposition: A | Discharge status: Home / Self Care | Payer: BLUE CROSS/BLUE SHIELD | Source: Ambulatory Visit | Attending: Gastroenterology | Admitting: Gastroenterology

## 2016-10-16 DIAGNOSIS — K640 First degree hemorrhoids: Secondary | ICD-10-CM | POA: Insufficient documentation

## 2016-10-16 DIAGNOSIS — D123 Benign neoplasm of transverse colon: Secondary | ICD-10-CM | POA: Diagnosis not present

## 2016-10-16 DIAGNOSIS — D122 Benign neoplasm of ascending colon: Secondary | ICD-10-CM | POA: Diagnosis not present

## 2016-10-16 DIAGNOSIS — Z7982 Long term (current) use of aspirin: Secondary | ICD-10-CM | POA: Diagnosis not present

## 2016-10-16 DIAGNOSIS — E785 Hyperlipidemia, unspecified: Secondary | ICD-10-CM | POA: Insufficient documentation

## 2016-10-16 DIAGNOSIS — Z86010 Personal history of colonic polyps: Secondary | ICD-10-CM | POA: Insufficient documentation

## 2016-10-16 DIAGNOSIS — F17290 Nicotine dependence, other tobacco product, uncomplicated: Secondary | ICD-10-CM | POA: Diagnosis not present

## 2016-10-16 DIAGNOSIS — Z79899 Other long term (current) drug therapy: Secondary | ICD-10-CM | POA: Insufficient documentation

## 2016-10-16 DIAGNOSIS — K635 Polyp of colon: Secondary | ICD-10-CM | POA: Diagnosis not present

## 2016-10-16 DIAGNOSIS — Z1211 Encounter for screening for malignant neoplasm of colon: Secondary | ICD-10-CM | POA: Insufficient documentation

## 2016-10-16 DIAGNOSIS — K648 Other hemorrhoids: Secondary | ICD-10-CM | POA: Diagnosis not present

## 2016-10-16 DIAGNOSIS — E669 Obesity, unspecified: Secondary | ICD-10-CM | POA: Diagnosis not present

## 2016-10-16 HISTORY — PX: COLONOSCOPY WITH PROPOFOL: SHX5780

## 2016-10-16 SURGERY — COLONOSCOPY WITH PROPOFOL
Anesthesia: General

## 2016-10-16 MED ORDER — PROPOFOL 10 MG/ML IV BOLUS
INTRAVENOUS | Status: DC | PRN
  Administered 2016-10-16 (×2): 40 mg via INTRAVENOUS
  Administered 2016-10-16: 20 mg via INTRAVENOUS

## 2016-10-16 MED ORDER — LIDOCAINE HCL (CARDIAC) 20 MG/ML IV SOLN
INTRAVENOUS | Status: DC | PRN
  Administered 2016-10-16: 30 mg via INTRAVENOUS

## 2016-10-16 MED ORDER — MIDAZOLAM HCL 2 MG/2ML IJ SOLN
INTRAMUSCULAR | Status: DC | PRN
  Administered 2016-10-16: 1 mg via INTRAVENOUS

## 2016-10-16 MED ORDER — PROPOFOL 500 MG/50ML IV EMUL
INTRAVENOUS | Status: DC | PRN
Start: 2016-10-16 — End: 2016-10-16
  Administered 2016-10-16: 160 ug/kg/min via INTRAVENOUS

## 2016-10-16 MED ORDER — SODIUM CHLORIDE 0.9 % IV SOLN
INTRAVENOUS | Status: DC
  Administered 2016-10-16: 1000 mL via INTRAVENOUS

## 2016-10-16 NOTE — H&P (Signed)
  Jonathon Bellows MD [address] [place], [place] [address] Phone: [phone] Fax : [phone]  Primary Care Physician:  Golden Pop, MD Primary Gastroenterologist:  Dr. Jonathon Bellows   Pre-Procedure History & Physical: HPI:  Willie Williams is a 55 y.o. male is here for an colonoscopy.   Past Medical History:  Diagnosis Date  . ED (erectile dysfunction)   . Gilbert's disease   . Hyperlipidemia   . Myopia   . Obesity   . Progressive iris atrophy of right eye   . Vitreous degeneration of right eye     Past Surgical History:  Procedure Laterality Date  . CARDIAC CATHETERIZATION    . COLONOSCOPY W/ POLYPECTOMY  2012    Prior to Admission medications   Medication Sig Start Date End Date Taking? Authorizing Provider  aspirin EC 325 MG tablet Take 325 mg by mouth daily.    Historical Provider, MD  atorvastatin (LIPITOR) 10 MG tablet Take 1 tablet (10 mg total) by mouth at bedtime. 03/03/16   Guadalupe Maple, MD  meloxicam (MOBIC) 15 MG tablet Take 1 tablet (15 mg total) by mouth daily. 03/03/16   Guadalupe Maple, MD  Multiple Vitamins tablet Take by mouth.    Historical Provider, MD  Omega-3 Fatty Acids (FISH OIL BURP-LESS PO) Take by mouth.    Historical Provider, MD  traZODone (DESYREL) 50 MG tablet 1 TABLET AT BEDTIME AS NEEDED ORAL FOR SLEEP 03/03/16   Guadalupe Maple, MD    Allergies as of 09/16/2016  . (No Known Allergies)    Family History  Problem Relation Age of Onset  . Cancer Mother   . Cancer Father   . Heart disease Father     Social History   Social History  . Marital status: Married    Spouse name: N/A  . Number of children: N/A  . Years of education: N/A   Occupational History  . Not on file.   Social History Main Topics  . Smoking status: Current Some Day Smoker    Types: Cigars  . Smokeless tobacco: Current User    Types: Snuff  . Alcohol use No  . Drug use: No  . Sexual activity: Not on file   Other Topics Concern  . Not on file     Social History Narrative  . No narrative on file    Review of Systems: See HPI, otherwise negative ROS  Physical Exam: There were no vitals taken for this visit. General:   Alert,  pleasant and cooperative in NAD Head:  Normocephalic and atraumatic. Neck:  Supple; no masses or thyromegaly. Lungs:  Clear throughout to auscultation.    Heart:  Regular rate and rhythm. Abdomen:  Soft, nontender and nondistended. Normal bowel sounds, without guarding, and without rebound.   Neurologic:  Alert and  oriented x4;  grossly normal neurologically.  Impression/Plan: Willie Williams is here for an colonoscopy to be performed for colorectal cancer screening with a personal history of colon polyps   Risks, benefits, limitations, and alternatives regarding  colonoscopy have been reviewed with the patient.  Questions have been answered.  All parties agreeable.   Jonathon Bellows, MD  10/16/2016, 7:29 AM

## 2016-10-16 NOTE — Op Note (Signed)
[facility] Gastroenterology Patient Name: Willie Williams Procedure Date: 10/16/2016 7:59 AM MRN: [id number] Account #: [id number] Date of Birth: 15-Jan-1961 Admit Type: Outpatient Age: 55 Room: [facility] ENDO ROOM 1 Gender: Male Note Status: Finalized Procedure:            Colonoscopy Indications:          High risk colon cancer surveillance: Personal history                        of colonic polyps Providers:            Jonathon Bellows MD, MD Referring MD:         Guadalupe Maple, MD (Referring MD) Medicines:            Monitored Anesthesia Care Complications:        No immediate complications. Procedure:            Pre-Anesthesia Assessment:                       - Prior to the procedure, a History and Physical was                        performed, and patient medications, allergies and                        sensitivities were reviewed. The patient's tolerance of                        previous anesthesia was reviewed.                       - The risks and benefits of the procedure and the                        sedation options and risks were discussed with the                        patient. All questions were answered and informed                        consent was obtained.                       - The risks and benefits of the procedure and the                        sedation options and risks were discussed with the                        patient. All questions were answered and informed                        consent was obtained.                       - ASA Grade Assessment: I - A normal, healthy patient.                       After obtaining informed consent, the colonoscope was  passed under direct vision. Throughout the procedure,                        the patient's blood pressure, pulse, and oxygen                        saturations were monitored continuously. The                        Colonoscope was introduced through the anus and                         advanced to the the cecum, identified by the                        appendiceal orifice, IC valve and transillumination.                        The colonoscopy was performed with ease. The patient                        tolerated the procedure well. The quality of the bowel                        preparation was excellent. Findings:      Non-bleeding internal hemorrhoids were found during retroflexion. The       hemorrhoids were large and Grade I (internal hemorrhoids that do not       prolapse).      A 5 mm polyp was found in the ascending colon. The polyp was sessile.       The polyp was removed with a cold biopsy forceps. Resection and       retrieval were complete.      A 8 mm polyp was found in the transverse colon. The polyp was sessile.       The polyp was removed with a cold snare. Resection and retrieval were       complete.      The exam was otherwise without abnormality. Impression:           - Non-bleeding internal hemorrhoids.                       - One 5 mm polyp in the ascending colon, removed with a                        cold biopsy forceps. Resected and retrieved.                       - One 8 mm polyp in the transverse colon, removed with                        a cold snare. Resected and retrieved.                       - The examination was otherwise normal. Recommendation:       - Discharge patient to home (with escort).                       - Resume previous diet.                       -  Continue present medications.                       - Await pathology results.                       - Repeat colonoscopy in 5 years for surveillance. Procedure Code(s):    --- Professional ---                       [phone], Colonoscopy, flexible; with removal of tumor(s),                        polyp(s), or other lesion(s) by snare technique                       [id number], 70, Colonoscopy, flexible; with biopsy, single                        or  multiple Diagnosis Code(s):    --- Professional ---                       Z86.010, Personal history of colonic polyps                       D12.2, Benign neoplasm of ascending colon                       D12.3, Benign neoplasm of transverse colon (hepatic                        flexure or splenic flexure)                       K64.0, First degree hemorrhoids CPT copyright 2016 American Medical Association. All rights reserved. The codes documented in this report are preliminary and upon coder review may  be revised to meet current compliance requirements. Jonathon Bellows, MD Jonathon Bellows MD, MD 10/16/2016 8:32:33 AM This report has been signed electronically. Number of Addenda: 0 Note Initiated On: 10/16/2016 7:59 AM Scope Withdrawal Time: 0 hours 22 minutes 10 seconds  Total Procedure Duration: 0 hours 26 minutes 41 seconds       [facility]

## 2016-10-16 NOTE — Anesthesia Procedure Notes (Signed)
Date/Time: 10/16/2016 8:01 AM Performed by: Johnna Acosta Pre-anesthesia Checklist: Patient identified, Emergency Drugs available, Suction available, Patient being monitored and Timeout performed Patient Re-evaluated:Patient Re-evaluated prior to inductionOxygen Delivery Method: Nasal cannula

## 2016-10-16 NOTE — Anesthesia Postprocedure Evaluation (Signed)
Anesthesia Post Note  Patient: Willie Williams  Procedure(s) Performed: Procedure(s) (LRB): COLONOSCOPY WITH PROPOFOL (N/A)  Patient location during evaluation: Endoscopy Anesthesia Type: General Level of consciousness: awake and alert Pain management: pain level controlled Vital Signs Assessment: post-procedure vital signs reviewed and stable Respiratory status: spontaneous breathing, nonlabored ventilation, respiratory function stable and patient connected to nasal cannula oxygen Cardiovascular status: blood pressure returned to baseline and stable Postop Assessment: no signs of nausea or vomiting Anesthetic complications: no    Last Vitals:  Vitals:   10/16/16 0853 10/16/16 0903  BP: 107/77 113/85  Pulse: (!) 48 (!) 56  Resp: 14 (!) 21  Temp:      Last Pain:  Vitals:   10/16/16 0833  TempSrc: Tympanic                 Martha Clan

## 2016-10-16 NOTE — Anesthesia Preprocedure Evaluation (Signed)
Anesthesia Evaluation  Patient identified by MRN, date of birth, ID band Patient awake    Reviewed: Allergy & Precautions, H&P , NPO status , Patient's Chart, lab work & pertinent test results, reviewed documented beta blocker date and time   Airway Mallampati: II  TM Distance: >3 FB Neck ROM: full    Dental  (+) Teeth Intact Permanent bridge:   Pulmonary neg shortness of breath, neg COPD, neg recent URI, Current Smoker,    Pulmonary exam normal breath sounds clear to auscultation       Cardiovascular Exercise Tolerance: Good negative cardio ROS Normal cardiovascular exam Rhythm:regular Rate:Normal     Neuro/Psych negative neurological ROS  negative psych ROS   GI/Hepatic negative GI ROS, Neg liver ROS,   Endo/Other  negative endocrine ROS  Renal/GU negative Renal ROS  negative genitourinary   Musculoskeletal   Abdominal   Peds  Hematology negative hematology ROS (+)   Anesthesia Other Findings Past Medical History: No date: ED (erectile dysfunction) No date: Gilbert's disease No date: Hyperlipidemia No date: Myopia No date: Obesity 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   Dental Advisory Given  Plan Discussed with: Anesthesiologist, CRNA and Surgeon  Anesthesia  Plan Comments:         Anesthesia Quick Evaluation

## 2016-10-16 NOTE — Transfer of Care (Signed)
Immediate Anesthesia Transfer of Care Note  Patient: Willie Williams  Procedure(s) Performed: Procedure(s): COLONOSCOPY WITH PROPOFOL (N/A)  Patient Location: PACU  Anesthesia Type:General  Level of Consciousness: sedated  Airway & Oxygen Therapy: Patient Spontanous Breathing and Patient connected to nasal cannula oxygen  Post-op Assessment: Report given to RN  Post vital signs: Reviewed and stable  Last Vitals:  Vitals:   10/16/16 0734 10/16/16 0833  BP: 126/85 100/72  Pulse: (!) 57 (!) 54  Resp: 20 12  Temp: 36.1 C (!) 36.1 C    Last Pain:  Vitals:   10/16/16 0833  TempSrc: Tympanic         Complications: No apparent anesthesia complications

## 2016-10-17 ENCOUNTER — Encounter: Payer: Self-pay | Admitting: Gastroenterology

## 2016-10-19 LAB — SURGICAL PATHOLOGY

## 2016-10-20 ENCOUNTER — Encounter: Payer: Self-pay | Admitting: Gastroenterology

## 2016-10-28 ENCOUNTER — Ambulatory Visit
Admission: EM | Admit: 2016-10-28 | Discharge: 2016-10-28 | Discharge status: Absent without leave | Payer: No Typology Code available for payment source

## 2016-10-28 ENCOUNTER — Telehealth: Payer: Self-pay

## 2016-10-28 NOTE — Telephone Encounter (Signed)
Pt notified of colonoscopy results. Added to recall.

## 2016-10-28 NOTE — Telephone Encounter (Signed)
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

## 2016-11-09 ENCOUNTER — Ambulatory Visit
Admission: EM | Admit: 2016-11-09 | Discharge: 2016-11-09 | Disposition: A | Discharge status: Home / Self Care | Payer: PRIVATE HEALTH INSURANCE | Attending: Family Medicine | Admitting: Family Medicine

## 2016-11-09 DIAGNOSIS — Z024 Encounter for examination for driving license: Secondary | ICD-10-CM

## 2016-11-09 LAB — DEPT OF TRANSP DIPSTICK, URINE (ARMC ONLY)
GLUCOSE, UA: NEGATIVE mg/dL
KETONES UR: NEGATIVE mg/dL
Leukocytes, UA: NEGATIVE
NITRITE: NEGATIVE
PROTEIN: NEGATIVE mg/dL
pH: 5 (ref 5.0–8.0)

## 2016-11-09 NOTE — ED Triage Notes (Signed)
DOT Physical

## 2016-11-09 NOTE — ED Provider Notes (Signed)
CSN: [id number]     Arrival date & time 11/09/16  1727 History   First MD Initiated Contact with Patient 11/09/16 1908     Chief Complaint  Patient presents with  . Commercial Driver's License Exam   (Consider location/radiation/quality/duration/timing/severity/associated sxs/prior Treatment) HPI  This a 56 year old male presents for a DOT physical. He has a history of cataract of the right eye which was extracted and a lens implantation performed. He also has a history of hyperlipidemia taking atorvastatin.     Past Medical History:  Diagnosis Date  . ED (erectile dysfunction)   . Gilbert's disease   . Hyperlipidemia   . Myopia   . Obesity   . Progressive iris atrophy of right eye   . Vitreous degeneration of right eye    Past Surgical History:  Procedure Laterality Date  . CARDIAC CATHETERIZATION    . COLONOSCOPY W/ POLYPECTOMY  2012  . COLONOSCOPY WITH PROPOFOL N/A 10/16/2016   Procedure: COLONOSCOPY WITH PROPOFOL;  Surgeon: Jonathon Bellows, MD;  Location: [facility] ENDOSCOPY;  Service: Endoscopy;  Laterality: N/A;  . EYE SURGERY     Family History  Problem Relation Age of Onset  . Cancer Mother   . Cancer Father   . Heart disease Father    Social History  Substance Use Topics  . Smoking status: Current Some Day Smoker    Types: Cigars  . Smokeless tobacco: Current User    Types: Snuff  . Alcohol use No    Review of Systems  All other systems reviewed and are negative.   Allergies  Patient has no known allergies.  Home Medications   Prior to Admission medications   Medication Sig Start Date End Date Taking? Authorizing Provider  aspirin EC 325 MG tablet Take 325 mg by mouth daily.   Yes Historical Provider, MD  atorvastatin (LIPITOR) 10 MG tablet Take 1 tablet (10 mg total) by mouth at bedtime. 03/03/16  Yes Guadalupe Maple, MD  Multiple Vitamins tablet Take by mouth.   Yes Historical Provider, MD  Omega-3 Fatty Acids (FISH OIL BURP-LESS PO) Take by mouth.   Yes  Historical Provider, MD  traZODone (DESYREL) 50 MG tablet 1 TABLET AT BEDTIME AS NEEDED ORAL FOR SLEEP 03/03/16   Guadalupe Maple, MD   Meds Ordered and Administered this Visit  Medications - No data to display  BP 140/80 (BP Location: Left Arm)   Pulse 75   Temp 98 F (36.7 C) (Oral)   Resp 18   Ht 5\' 11"  (1.803 m)   Wt 259 lb (117.5 kg)   SpO2 100%   BMI 36.12 kg/m  No data found.   Physical Exam  Constitutional:  Referred to DOT physical  Nursing note and vitals reviewed.   Urgent Care Course   Clinical Course     Procedures (including critical care time)  Labs Review Labs Reviewed  DEPT OF TRANSP DIPSTICK, URINE([facility] ONLY) - Abnormal; Notable for the following:       Result Value   Specific Gravity, Urine >1.030 (*)    Hgb urine dipstick TRACE (*)    Bilirubin Urine SMALL (*)    All other components within normal limits    Imaging Review No results found.   Visual Acuity Review  Right Eye Distance:   Left Eye Distance:   Bilateral Distance:    Right Eye Near:   Left Eye Near:    Bilateral Near:         MDM  1. Driver's permit PE (physical examination)    Patient is taking atorvastatin for hypercholesterolemia. He is not taking any other prescriptive medications. He has no other medical problems requiring medication. His eyesight is acceptable with correction. He qualifies for 2 year certificate    Lorin Picket, PA-C 11/09/16 1941

## 2016-11-27 ENCOUNTER — Telehealth: Payer: Self-pay | Admitting: Family Medicine

## 2016-11-30 NOTE — Telephone Encounter (Signed)
LMOM for patient to call me back regarding billing questions.

## 2017-03-08 ENCOUNTER — Encounter: Payer: BLUE CROSS/BLUE SHIELD | Admitting: Family Medicine

## 2017-05-01 ENCOUNTER — Other Ambulatory Visit: Payer: Self-pay | Admitting: Family Medicine

## 2017-05-01 DIAGNOSIS — E785 Hyperlipidemia, unspecified: Secondary | ICD-10-CM

## 2017-05-02 NOTE — Telephone Encounter (Signed)
apt 

## 2017-05-04 ENCOUNTER — Other Ambulatory Visit: Payer: Self-pay | Admitting: Family Medicine

## 2017-05-04 DIAGNOSIS — E785 Hyperlipidemia, unspecified: Secondary | ICD-10-CM

## 2017-05-04 MED ORDER — ATORVASTATIN CALCIUM 10 MG PO TABS: 10.0000 mg | ORAL_TABLET | Freq: Every day | ORAL | 1 refills | Status: DC

## 2017-05-04 NOTE — Telephone Encounter (Signed)
Last OV: 09/07/16 Next OV: 05/24/17  Lab Results  Component Value Date   CHOL 131 09/07/2016   HDL 29 (L) 03/03/2016   LDLCALC 76 03/03/2016   TRIG 206 (H) 09/07/2016   Lab Results  Component Value Date   CREATININE 1.00 09/07/2016   BUN 11 09/07/2016   NA 143 09/07/2016   K 4.1 09/07/2016   CL 105 09/07/2016   CO2 22 09/07/2016

## 2017-05-04 NOTE — Telephone Encounter (Signed)
Patient needs a refill on his Lipitor called in to [facility].  He has an appt scheduled with Dr Jeananne Rama on 7/23. Will Dr Jeananne Rama refill for 90 days if not he said just wait until after his appt with him.  He also would like to have his labs drawn the morning of his appt if possible.  Thanks

## 2017-05-21 ENCOUNTER — Encounter: Payer: Self-pay | Admitting: Family Medicine

## 2017-05-24 ENCOUNTER — Encounter: Payer: Self-pay | Admitting: Family Medicine

## 2017-05-24 ENCOUNTER — Ambulatory Visit (INDEPENDENT_AMBULATORY_CARE_PROVIDER_SITE_OTHER): Payer: BLUE CROSS/BLUE SHIELD | Admitting: Family Medicine

## 2017-05-24 VITALS — BP 129/87 | HR 66 | Ht 70.87 in | Wt 255.0 lb

## 2017-05-24 DIAGNOSIS — Z131 Encounter for screening for diabetes mellitus: Secondary | ICD-10-CM

## 2017-05-24 DIAGNOSIS — M5136 Other intervertebral disc degeneration, lumbar region: Secondary | ICD-10-CM | POA: Diagnosis not present

## 2017-05-24 DIAGNOSIS — Z0000 Encounter for general adult medical examination without abnormal findings: Secondary | ICD-10-CM | POA: Diagnosis not present

## 2017-05-24 DIAGNOSIS — E782 Mixed hyperlipidemia: Secondary | ICD-10-CM

## 2017-05-24 DIAGNOSIS — E785 Hyperlipidemia, unspecified: Secondary | ICD-10-CM

## 2017-05-24 DIAGNOSIS — Z125 Encounter for screening for malignant neoplasm of prostate: Secondary | ICD-10-CM | POA: Diagnosis not present

## 2017-05-24 DIAGNOSIS — Z1329 Encounter for screening for other suspected endocrine disorder: Secondary | ICD-10-CM

## 2017-05-24 MED ORDER — ATORVASTATIN CALCIUM 10 MG PO TABS: 10.0000 mg | ORAL_TABLET | Freq: Every day | ORAL | 4 refills | Status: DC

## 2017-05-24 MED ORDER — TRAZODONE HCL 50 MG PO TABS: ORAL_TABLET | ORAL | 4 refills | Status: DC

## 2017-05-24 MED ORDER — MELOXICAM 15 MG PO TABS: 15.0000 mg | ORAL_TABLET | Freq: Every day | ORAL | 2 refills | Status: DC

## 2017-05-24 NOTE — Progress Notes (Signed)
BP 129/87   Pulse 66   Ht 5' 10.87" (1.8 m)   Wt 255 lb (115.7 kg)   SpO2 98%   BMI 35.70 kg/m    Subjective:    Patient ID: Willie Williams, male    DOB: 1961-07-24, 56 y.o.   MRN: [id number]  HPI: SHERWOOD CASTILLA Williams is a 56 y.o. male  Annual exam: Asian all in all doing well taking cholesterol medicine without problems uses occasional trazodone for sleep otherwise doing okay. Still working very hard.   Relevant past medical, surgical, family and social history reviewed and updated as indicated. Interim medical history since our last visit reviewed. Allergies and medications reviewed and updated.  Review of Systems  Constitutional: Negative.   HENT: Negative.   Eyes: Negative.   Respiratory: Negative.   Cardiovascular: Negative.   Gastrointestinal: Negative.   Endocrine: Negative.   Genitourinary: Negative.   Musculoskeletal: Negative.   Skin: Negative.   Allergic/Immunologic: Negative.   Neurological: Negative.   Hematological: Negative.   Psychiatric/Behavioral: Negative.     Per HPI unless specifically indicated above     Objective:    BP 129/87   Pulse 66   Ht 5' 10.87" (1.8 m)   Wt 255 lb (115.7 kg)   SpO2 98%   BMI 35.70 kg/m   Wt Readings from Last 3 Encounters:  05/24/17 255 lb (115.7 kg)  11/09/16 259 lb (117.5 kg)  10/16/16 250 lb (113.4 kg)    Physical Exam  Constitutional: He is oriented to person, place, and time. He appears well-developed and well-nourished.  HENT:  Head: Normocephalic and atraumatic.  Right Ear: External ear normal.  Left Ear: External ear normal.  Eyes: Pupils are equal, round, and reactive to light. Conjunctivae and EOM are normal.  Neck: Normal range of motion. Neck supple.  Cardiovascular: Normal rate, regular rhythm, normal heart sounds and intact distal pulses.   Pulmonary/Chest: Effort normal and breath sounds normal.  Abdominal: Soft. Bowel sounds are normal. There is no splenomegaly or hepatomegaly.    Genitourinary: Rectum normal, prostate normal and penis normal.  Musculoskeletal: Normal range of motion.  Neurological: He is alert and oriented to person, place, and time. He has normal reflexes.  Skin: No rash noted. No erythema.  Psychiatric: He has a normal mood and affect. His behavior is normal. Judgment and thought content normal.    Results for orders placed or performed during the hospital encounter of 11/09/16  Dept of Transp dipstick, urine  Result Value Ref Range   Protein, ur NEGATIVE NEGATIVE mg/dL   Glucose, UA NEGATIVE NEGATIVE mg/dL   Specific Gravity, Urine >1.030 (H) 1.005 - 1.030   Hgb urine dipstick TRACE (A) NEGATIVE   Color, Urine YELLOW YELLOW   APPearance CLEAR CLEAR   pH 5.0 5.0 - 8.0   Bilirubin Urine SMALL (A) NEGATIVE   Ketones, ur NEGATIVE NEGATIVE mg/dL   Nitrite NEGATIVE NEGATIVE   Leukocytes, UA NEGATIVE NEGATIVE      Assessment & Plan:   Problem List Items Addressed This Visit      Musculoskeletal and Integument   Degeneration of lumbar intervertebral disc     Other   Hyperlipidemia    The current medical regimen is effective;  continue present plan and medications.       Relevant Orders   Comprehensive metabolic panel   Lipid panel    Other Visit Diagnoses    Annual physical exam    -  Primary  Screening for diabetes mellitus (DM)       Relevant Orders   CBC with Differential/Platelet   Urinalysis, Routine w reflex microscopic   Prostate cancer screening       Relevant Orders   PSA   Thyroid disorder screen       Relevant Orders   TSH       Follow up plan: No Follow-up on file.

## 2017-05-24 NOTE — Assessment & Plan Note (Signed)
The current medical regimen is effective;  continue present plan and medications.  

## 2017-05-25 LAB — COMPREHENSIVE METABOLIC PANEL
ALK PHOS: 64 IU/L (ref 39–117)
ALT: 34 IU/L (ref 0–44)
AST: 23 IU/L (ref 0–40)
Albumin/Globulin Ratio: 2.2 (ref 1.2–2.2)
Albumin: 4.8 g/dL (ref 3.5–5.5)
BUN/Creatinine Ratio: 13 (ref 9–20)
BUN: 13 mg/dL (ref 6–24)
Bilirubin Total: 1.2 mg/dL (ref 0.0–1.2)
CO2: 22 mmol/L (ref 20–29)
CREATININE: 1.03 mg/dL (ref 0.76–1.27)
Calcium: 9.4 mg/dL (ref 8.7–10.2)
Chloride: 102 mmol/L (ref 96–106)
GFR calc Af Amer: 93 mL/min/{1.73_m2} (ref >59)
GFR calc non Af Amer: 81 mL/min/{1.73_m2} (ref >59)
GLUCOSE: 90 mg/dL (ref 65–99)
Globulin, Total: 2.2 g/dL (ref 1.5–4.5)
Potassium: 4.6 mmol/L (ref 3.5–5.2)
SODIUM: 141 mmol/L (ref 134–144)
Total Protein: 7 g/dL (ref 6.0–8.5)

## 2017-05-25 LAB — CBC WITH DIFFERENTIAL/PLATELET
BASOS ABS: 0 10*3/uL (ref 0.0–0.2)
Basos: 0 %
EOS (ABSOLUTE): 0.2 10*3/uL (ref 0.0–0.4)
Eos: 2 %
Hematocrit: 48.3 % (ref 37.5–51.0)
Hemoglobin: 16.4 g/dL (ref 13.0–17.7)
IMMATURE GRANULOCYTES: 0 %
Immature Grans (Abs): 0 10*3/uL (ref 0.0–0.1)
LYMPHS ABS: 3 10*3/uL (ref 0.7–3.1)
Lymphs: 35 %
MCH: 30.4 pg (ref 26.6–33.0)
MCHC: 34 g/dL (ref 31.5–35.7)
MCV: 90 fL (ref 79–97)
MONOCYTES: 10 %
MONOS ABS: 0.8 10*3/uL (ref 0.1–0.9)
NEUTROS PCT: 53 %
Neutrophils Absolute: 4.4 10*3/uL (ref 1.4–7.0)
PLATELETS: 188 10*3/uL (ref 150–379)
RBC: 5.39 x10E6/uL (ref 4.14–5.80)
RDW: 14.2 % (ref 12.3–15.4)
WBC: 8.4 10*3/uL (ref 3.4–10.8)

## 2017-05-25 LAB — LIPID PANEL
CHOLESTEROL TOTAL: 157 mg/dL (ref 100–199)
Chol/HDL Ratio: 5.1 ratio — ABNORMAL HIGH (ref 0.0–5.0)
HDL: 31 mg/dL — AB (ref >39)
LDL CALC: 53 mg/dL (ref 0–99)
TRIGLYCERIDES: 363 mg/dL — AB (ref 0–149)
VLDL CHOLESTEROL CAL: 73 mg/dL — AB (ref 5–40)

## 2017-05-25 LAB — TSH: TSH: 2.44 u[IU]/mL (ref 0.450–4.500)

## 2017-05-25 LAB — PSA: Prostate Specific Ag, Serum: 0.7 ng/mL (ref 0.0–4.0)

## 2017-05-26 ENCOUNTER — Encounter: Payer: Self-pay | Admitting: Family Medicine

## 2017-05-27 LAB — URINALYSIS, ROUTINE W REFLEX MICROSCOPIC
BILIRUBIN UA: NEGATIVE
GLUCOSE, UA: NEGATIVE
Ketones, UA: NEGATIVE
LEUKOCYTES UA: NEGATIVE
Nitrite, UA: NEGATIVE
PH UA: 5 (ref 5.0–7.5)
PROTEIN UA: NEGATIVE
SPEC GRAV UA: 1.025 (ref 1.005–1.030)
Urobilinogen, Ur: 0.2 mg/dL (ref 0.2–1.0)

## 2017-05-27 LAB — MICROSCOPIC EXAMINATION

## 2017-11-08 ENCOUNTER — Ambulatory Visit: Payer: BLUE CROSS/BLUE SHIELD | Admitting: Family Medicine

## 2017-11-08 DIAGNOSIS — L814 Other melanin hyperpigmentation: Secondary | ICD-10-CM | POA: Diagnosis not present

## 2017-11-08 DIAGNOSIS — L821 Other seborrheic keratosis: Secondary | ICD-10-CM | POA: Diagnosis not present

## 2017-11-08 DIAGNOSIS — L570 Actinic keratosis: Secondary | ICD-10-CM | POA: Diagnosis not present

## 2017-11-08 DIAGNOSIS — L281 Prurigo nodularis: Secondary | ICD-10-CM | POA: Diagnosis not present

## 2017-12-20 DIAGNOSIS — L570 Actinic keratosis: Secondary | ICD-10-CM | POA: Diagnosis not present

## 2018-01-25 ENCOUNTER — Telehealth: Payer: Self-pay | Admitting: Family Medicine

## 2018-01-25 DIAGNOSIS — E785 Hyperlipidemia, unspecified: Secondary | ICD-10-CM

## 2018-01-25 NOTE — Telephone Encounter (Signed)
Copied from [place] [id number]. Topic: Quick Communication - Rx Refill/Question >> Jan 25, 2018  4:02 PM Waylan Rocher, Lumin L wrote: Medication: atorvastatin (LIPITOR) 10 MG tablet (90 days supply) Has the patient contacted their pharmacy? Yes.   (Agent: If no, request that the patient contact the pharmacy for the refill.) Preferred Pharmacy (with phone number or street name): [facility] [place] [facility] [place] [place] [address] Phone: [phone] Fax: [phone] Agent: Please be advised that RX refills may take up to 3 business days. We ask that you follow-up with your pharmacy.

## 2018-01-26 NOTE — Addendum Note (Signed)
Addended by: Curlene Labrum on: 01/26/2018 08:29 AM   Modules accepted: Orders

## 2018-01-26 NOTE — Telephone Encounter (Signed)
Pt called to clarify his pharmacy selection. No answer, left message to call and back for questions. Also to call his new pharmacy to ask his old pharmacy to transfer the prescription to the new one if that is what he needs.

## 2018-01-27 MED ORDER — ATORVASTATIN CALCIUM 10 MG PO TABS: 10.0000 mg | ORAL_TABLET | Freq: Every day | ORAL | 1 refills | Status: DC

## 2018-01-27 NOTE — Telephone Encounter (Signed)
LOV   05/24/17 Did not return for 6 month checkup. NOV  05/24/18 Provider: Golden Pop  Please advise

## 2018-01-27 NOTE — Addendum Note (Signed)
Addended by: Curlene Labrum on: 01/27/2018 07:51 AM   Modules accepted: Orders

## 2018-01-28 NOTE — Telephone Encounter (Signed)
Pt would like 90 day supply. Pt is out of med

## 2018-05-24 ENCOUNTER — Encounter: Payer: Self-pay | Admitting: Family Medicine

## 2018-05-24 ENCOUNTER — Ambulatory Visit (INDEPENDENT_AMBULATORY_CARE_PROVIDER_SITE_OTHER): Payer: BLUE CROSS/BLUE SHIELD | Admitting: Family Medicine

## 2018-05-24 VITALS — BP 138/89 | HR 58 | Ht 69.69 in | Wt 253.0 lb

## 2018-05-24 DIAGNOSIS — Z125 Encounter for screening for malignant neoplasm of prostate: Secondary | ICD-10-CM

## 2018-05-24 DIAGNOSIS — Z1329 Encounter for screening for other suspected endocrine disorder: Secondary | ICD-10-CM | POA: Diagnosis not present

## 2018-05-24 DIAGNOSIS — E782 Mixed hyperlipidemia: Secondary | ICD-10-CM | POA: Diagnosis not present

## 2018-05-24 DIAGNOSIS — M5136 Other intervertebral disc degeneration, lumbar region: Secondary | ICD-10-CM

## 2018-05-24 DIAGNOSIS — Z114 Encounter for screening for human immunodeficiency virus [HIV]: Secondary | ICD-10-CM

## 2018-05-24 DIAGNOSIS — E785 Hyperlipidemia, unspecified: Secondary | ICD-10-CM

## 2018-05-24 DIAGNOSIS — Z131 Encounter for screening for diabetes mellitus: Secondary | ICD-10-CM | POA: Diagnosis not present

## 2018-05-24 DIAGNOSIS — S86912A Strain of unspecified muscle(s) and tendon(s) at lower leg level, left leg, initial encounter: Secondary | ICD-10-CM

## 2018-05-24 LAB — MICROSCOPIC EXAMINATION: Bacteria, UA: NONE SEEN

## 2018-05-24 LAB — URINALYSIS, ROUTINE W REFLEX MICROSCOPIC
Bilirubin, UA: NEGATIVE
Glucose, UA: NEGATIVE
Ketones, UA: NEGATIVE
Leukocytes, UA: NEGATIVE
Nitrite, UA: NEGATIVE
Protein, UA: NEGATIVE
Specific Gravity, UA: 1.025 (ref 1.005–1.030)
Urobilinogen, Ur: 0.2 mg/dL (ref 0.2–1.0)
pH, UA: 5 (ref 5.0–7.5)

## 2018-05-24 MED ORDER — MELOXICAM 15 MG PO TABS: 15.0000 mg | ORAL_TABLET | Freq: Every day | ORAL | 2 refills | Status: DC

## 2018-05-24 MED ORDER — ATORVASTATIN CALCIUM 10 MG PO TABS: 10.0000 mg | ORAL_TABLET | Freq: Every day | ORAL | 4 refills | Status: DC

## 2018-05-24 NOTE — Assessment & Plan Note (Signed)
Discussed left knee strain use of meloxicam and care and treatment will reevaluate.

## 2018-05-24 NOTE — Assessment & Plan Note (Signed)
The current medical regimen is effective;  continue present plan and medications.  

## 2018-05-24 NOTE — Progress Notes (Signed)
BP 138/89 (BP Location: Left Arm)   Pulse (!) 58   Ht 5' 9.69" (1.77 m)   Wt 253 lb (114.8 kg)   SpO2 94%   BMI 36.63 kg/m    Subjective:    Patient ID: Willie Williams, male    DOB: 19-Jan-1961, 57 y.o.   MRN: [id number]  HPI: Willie Williams is a 57 y.o. male  Chief Complaint  Patient presents with  . Annual Exam  Patient follow-up doing well has been taking Lipitor without problems occasional meloxicam.  Patient has some hurt his left knee superficial surface lateral just lateral of left patella.  No swelling no redness but very tender to the touch no clicking locking giving way.  Relevant past medical, surgical, family and social history reviewed and updated as indicated. Interim medical history since our last visit reviewed. Allergies and medications reviewed and updated.  Review of Systems  Constitutional: Negative.   HENT: Negative.   Eyes: Negative.   Respiratory: Negative.   Cardiovascular: Negative.   Gastrointestinal: Negative.   Endocrine: Negative.   Genitourinary: Negative.   Musculoskeletal: Negative.   Skin: Negative.   Allergic/Immunologic: Negative.   Neurological: Negative.   Hematological: Negative.   Psychiatric/Behavioral: Negative.     Per HPI unless specifically indicated above     Objective:    BP 138/89 (BP Location: Left Arm)   Pulse (!) 58   Ht 5' 9.69" (1.77 m)   Wt 253 lb (114.8 kg)   SpO2 94%   BMI 36.63 kg/m   Wt Readings from Last 3 Encounters:  05/24/18 253 lb (114.8 kg)  05/24/17 255 lb (115.7 kg)  11/09/16 259 lb (117.5 kg)    Physical Exam  Constitutional: He is oriented to person, place, and time. He appears well-developed and well-nourished.  HENT:  Head: Normocephalic and atraumatic.  Right Ear: External ear normal.  Left Ear: External ear normal.  Eyes: Pupils are equal, round, and reactive to light. Conjunctivae and EOM are normal.  Neck: Normal range of motion. Neck supple.  Cardiovascular: Normal rate,  regular rhythm, normal heart sounds and intact distal pulses.  Pulmonary/Chest: Effort normal and breath sounds normal.  Abdominal: Soft. Bowel sounds are normal. There is no splenomegaly or hepatomegaly.  Genitourinary: Rectum normal, prostate normal and penis normal.  Musculoskeletal: Normal range of motion.  Neurological: He is alert and oriented to person, place, and time. He has normal reflexes.  Skin: No rash noted. No erythema.  Psychiatric: He has a normal mood and affect. His behavior is normal. Judgment and thought content normal.    Results for orders placed or performed in visit on 05/24/17  Microscopic Examination  Result Value Ref Range   WBC, UA 0-5 0 - 5 /hpf   Epithelial Cells (non renal) CANCELED   CBC with Differential/Platelet  Result Value Ref Range   WBC 8.4 3.4 - 10.8 x10E3/uL   RBC 5.39 4.14 - 5.80 x10E6/uL   Hemoglobin 16.4 13.0 - 17.7 g/dL   Hematocrit 48.3 37.5 - 51.0 %   MCV 90 79 - 97 fL   MCH 30.4 26.6 - 33.0 pg   MCHC 34.0 31.5 - 35.7 g/dL   RDW 14.2 12.3 - 15.4 %   Platelets 188 150 - 379 x10E3/uL   Neutrophils 53 Not Estab. %   Lymphs 35 Not Estab. %   Monocytes 10 Not Estab. %   Eos 2 Not Estab. %   Basos 0 Not Estab. %  Neutrophils Absolute 4.4 1.4 - 7.0 x10E3/uL   Lymphocytes Absolute 3.0 0.7 - 3.1 x10E3/uL   Monocytes Absolute 0.8 0.1 - 0.9 x10E3/uL   EOS (ABSOLUTE) 0.2 0.0 - 0.4 x10E3/uL   Basophils Absolute 0.0 0.0 - 0.2 x10E3/uL   Immature Granulocytes 0 Not Estab. %   Immature Grans (Abs) 0.0 0.0 - 0.1 x10E3/uL  Comprehensive metabolic panel  Result Value Ref Range   Glucose 90 65 - 99 mg/dL   BUN 13 6 - 24 mg/dL   Creatinine, Ser 1.03 0.76 - 1.27 mg/dL   GFR calc non Af Amer 81 >59 mL/min/1.73   GFR calc Af Amer 93 >59 mL/min/1.73   BUN/Creatinine Ratio 13 9 - 20   Sodium 141 134 - 144 mmol/L   Potassium 4.6 3.5 - 5.2 mmol/L   Chloride 102 96 - 106 mmol/L   CO2 22 20 - 29 mmol/L   Calcium 9.4 8.7 - 10.2 mg/dL   Total  Protein 7.0 6.0 - 8.5 g/dL   Albumin 4.8 3.5 - 5.5 g/dL   Globulin, Total 2.2 1.5 - 4.5 g/dL   Albumin/Globulin Ratio 2.2 1.2 - 2.2   Bilirubin Total 1.2 0.0 - 1.2 mg/dL   Alkaline Phosphatase 64 39 - 117 IU/L   AST 23 0 - 40 IU/L   ALT 34 0 - 44 IU/L  Lipid panel  Result Value Ref Range   Cholesterol, Total 157 100 - 199 mg/dL   Triglycerides 363 (H) 0 - 149 mg/dL   HDL 31 (L) >39 mg/dL   VLDL Cholesterol Cal 73 (H) 5 - 40 mg/dL   LDL Calculated 53 0 - 99 mg/dL   Chol/HDL Ratio 5.1 (H) 0.0 - 5.0 ratio  PSA  Result Value Ref Range   Prostate Specific Ag, Serum 0.7 0.0 - 4.0 ng/mL  TSH  Result Value Ref Range   TSH 2.440 0.450 - 4.500 uIU/mL  Urinalysis, Routine w reflex microscopic  Result Value Ref Range   Specific Gravity, UA 1.025 1.005 - 1.030   pH, UA 5.0 5.0 - 7.5   Color, UA Yellow Yellow   Appearance Ur Clear Clear   Leukocytes, UA Negative Negative   Protein, UA Negative Negative/Trace   Glucose, UA Negative Negative   Ketones, UA Negative Negative   RBC, UA 1+ (A) Negative   Bilirubin, UA Negative Negative   Urobilinogen, Ur 0.2 0.2 - 1.0 mg/dL   Nitrite, UA Negative Negative   Microscopic Examination See below:       Assessment & Plan:   Problem List Items Addressed This Visit      Musculoskeletal and Integument   Degeneration of lumbar intervertebral disc   Relevant Medications   meloxicam (MOBIC) 15 MG tablet     Other   Hyperlipidemia - Primary    The current medical regimen is effective;  continue present plan and medications.       Relevant Medications   atorvastatin (LIPITOR) 10 MG tablet   Other Relevant Orders   CBC with Differential/Platelet   Comprehensive metabolic panel   Lipid panel   Urinalysis, Routine w reflex microscopic   Knee strain, left, initial encounter    Discussed left knee strain use of meloxicam and care and treatment will reevaluate.       Other Visit Diagnoses    Screening for diabetes mellitus (DM)        Relevant Orders   CBC with Differential/Platelet   Comprehensive metabolic panel   Lipid panel   Urinalysis,  Routine w reflex microscopic   Thyroid disorder screen       Relevant Orders   TSH   Prostate cancer screening       Relevant Orders   PSA   Encounter for screening for HIV       Relevant Orders   HIV antibody (with reflex)       Follow up plan: Return in about 6 months (around 11/24/2018).

## 2018-05-25 ENCOUNTER — Encounter: Payer: Self-pay | Admitting: Family Medicine

## 2018-05-25 LAB — CBC WITH DIFFERENTIAL/PLATELET
Basophils Absolute: 0.1 10*3/uL (ref 0.0–0.2)
Basos: 1 %
EOS (ABSOLUTE): 0.2 10*3/uL (ref 0.0–0.4)
EOS: 2 %
HEMATOCRIT: 46.7 % (ref 37.5–51.0)
HEMOGLOBIN: 15.9 g/dL (ref 13.0–17.7)
IMMATURE GRANS (ABS): 0 10*3/uL (ref 0.0–0.1)
Immature Granulocytes: 0 %
LYMPHS ABS: 3.2 10*3/uL — AB (ref 0.7–3.1)
LYMPHS: 44 %
MCH: 30.8 pg (ref 26.6–33.0)
MCHC: 34 g/dL (ref 31.5–35.7)
MCV: 91 fL (ref 79–97)
MONOCYTES: 12 %
Monocytes Absolute: 0.8 10*3/uL (ref 0.1–0.9)
Neutrophils Absolute: 2.9 10*3/uL (ref 1.4–7.0)
Neutrophils: 41 %
Platelets: 196 10*3/uL (ref 150–450)
RBC: 5.16 x10E6/uL (ref 4.14–5.80)
RDW: 14 % (ref 12.3–15.4)
WBC: 7.1 10*3/uL (ref 3.4–10.8)

## 2018-05-25 LAB — COMPREHENSIVE METABOLIC PANEL
ALBUMIN: 4.9 g/dL (ref 3.5–5.5)
ALK PHOS: 64 IU/L (ref 39–117)
ALT: 42 IU/L (ref 0–44)
AST: 30 IU/L (ref 0–40)
Albumin/Globulin Ratio: 2.1 (ref 1.2–2.2)
BUN / CREAT RATIO: 16 (ref 9–20)
BUN: 16 mg/dL (ref 6–24)
Bilirubin Total: 1.4 mg/dL — ABNORMAL HIGH (ref 0.0–1.2)
CALCIUM: 9.3 mg/dL (ref 8.7–10.2)
CO2: 22 mmol/L (ref 20–29)
CREATININE: 1.03 mg/dL (ref 0.76–1.27)
Chloride: 100 mmol/L (ref 96–106)
GFR, EST AFRICAN AMERICAN: 93 mL/min/{1.73_m2} (ref >59)
GFR, EST NON AFRICAN AMERICAN: 80 mL/min/{1.73_m2} (ref >59)
GLOBULIN, TOTAL: 2.3 g/dL (ref 1.5–4.5)
Glucose: 78 mg/dL (ref 65–99)
Potassium: 4.3 mmol/L (ref 3.5–5.2)
SODIUM: 139 mmol/L (ref 134–144)
TOTAL PROTEIN: 7.2 g/dL (ref 6.0–8.5)

## 2018-05-25 LAB — LIPID PANEL
CHOL/HDL RATIO: 4.7 ratio (ref 0.0–5.0)
Cholesterol, Total: 154 mg/dL (ref 100–199)
HDL: 33 mg/dL — ABNORMAL LOW (ref >39)
LDL CALC: 62 mg/dL (ref 0–99)
Triglycerides: 295 mg/dL — ABNORMAL HIGH (ref 0–149)
VLDL Cholesterol Cal: 59 mg/dL — ABNORMAL HIGH (ref 5–40)

## 2018-05-25 LAB — PSA: Prostate Specific Ag, Serum: 0.7 ng/mL (ref 0.0–4.0)

## 2018-05-25 LAB — HIV ANTIBODY (ROUTINE TESTING W REFLEX): HIV Screen 4th Generation wRfx: NONREACTIVE

## 2018-05-25 LAB — TSH: TSH: 2.65 u[IU]/mL (ref 0.450–4.500)

## 2018-05-31 ENCOUNTER — Telehealth: Payer: Self-pay | Admitting: Family Medicine

## 2018-05-31 NOTE — Telephone Encounter (Signed)
Copied from [place] [id number]. Topic: Inquiry >> May 31, 2018  6:04 PM Cecelia Byars, NT wrote: Reason for CRM: Patient called to follow up on paper work that was supposed to have been faxed to [facility] after his last visit once the labs were resulted  please call him once this is faxed to them [phone]

## 2018-06-01 NOTE — Telephone Encounter (Signed)
Do you know anything about this paper work?  

## 2018-06-02 NOTE — Telephone Encounter (Signed)
Form was on PCP's desk awaiting signature. Mrs. Willie Williams, [place] cosigned and form was faxed. Message left to make pt aware.

## 2018-08-12 DIAGNOSIS — Z23 Encounter for immunization: Secondary | ICD-10-CM | POA: Diagnosis not present

## 2018-12-05 ENCOUNTER — Ambulatory Visit: Payer: BLUE CROSS/BLUE SHIELD | Admitting: Family Medicine

## 2019-03-30 ENCOUNTER — Other Ambulatory Visit: Payer: Self-pay | Admitting: Family Medicine

## 2019-06-07 ENCOUNTER — Telehealth: Payer: Self-pay | Admitting: Family Medicine

## 2019-06-07 NOTE — Telephone Encounter (Signed)
Called pt reschedule physical no answer left vm

## 2019-06-12 ENCOUNTER — Encounter: Payer: BLUE CROSS/BLUE SHIELD | Admitting: Family Medicine

## 2019-06-12 DIAGNOSIS — Z1159 Encounter for screening for other viral diseases: Secondary | ICD-10-CM | POA: Diagnosis not present

## 2019-07-17 ENCOUNTER — Encounter: Payer: BLUE CROSS/BLUE SHIELD | Admitting: Family Medicine

## 2019-08-11 DIAGNOSIS — Z23 Encounter for immunization: Secondary | ICD-10-CM | POA: Diagnosis not present

## 2019-09-18 ENCOUNTER — Other Ambulatory Visit: Payer: Self-pay

## 2019-09-18 ENCOUNTER — Encounter: Payer: Self-pay | Admitting: Nurse Practitioner

## 2019-09-18 ENCOUNTER — Ambulatory Visit (INDEPENDENT_AMBULATORY_CARE_PROVIDER_SITE_OTHER): Payer: BC Managed Care – PPO | Admitting: Nurse Practitioner

## 2019-09-18 VITALS — BP 126/82 | HR 55 | Temp 98.0°F | Ht 69.7 in | Wt 265.0 lb

## 2019-09-18 DIAGNOSIS — Z0000 Encounter for general adult medical examination without abnormal findings: Secondary | ICD-10-CM | POA: Diagnosis not present

## 2019-09-18 DIAGNOSIS — E6609 Other obesity due to excess calories: Secondary | ICD-10-CM | POA: Diagnosis not present

## 2019-09-18 DIAGNOSIS — Z23 Encounter for immunization: Secondary | ICD-10-CM

## 2019-09-18 DIAGNOSIS — E782 Mixed hyperlipidemia: Secondary | ICD-10-CM

## 2019-09-18 DIAGNOSIS — Z6838 Body mass index (BMI) 38.0-38.9, adult: Secondary | ICD-10-CM

## 2019-09-18 DIAGNOSIS — F17210 Nicotine dependence, cigarettes, uncomplicated: Secondary | ICD-10-CM | POA: Diagnosis not present

## 2019-09-18 DIAGNOSIS — E669 Obesity, unspecified: Secondary | ICD-10-CM | POA: Insufficient documentation

## 2019-09-18 NOTE — Assessment & Plan Note (Signed)
Chronic, ongoing.  Recheck lipid panel and CMP today.  Continue current medication regimen and adjust as needed.

## 2019-09-18 NOTE — Assessment & Plan Note (Signed)
Recommend continued focus on health diet choices and regular physical activity (30 minutes 5 days a week). 

## 2019-09-18 NOTE — Progress Notes (Signed)
BP 126/82 (BP Location: Left Arm, Patient Position: Sitting)   Pulse (!) 55   Temp 98 F (36.7 C) (Oral)   Ht 5' 9.7" (1.77 m)   Wt 265 lb (120.2 kg)   SpO2 96%   BMI 38.35 kg/m    Subjective:    Patient ID: Willie Williams, male    DOB: 03/06/1961, 58 y.o.   MRN: [id number]  HPI: Willie Williams is a 58 y.o. male presenting on 09/18/2019 for comprehensive medical examination. Current medical complaints include:none  He currently lives with: significant other Interim Problems from his last visit: no   HYPERLIPIDEMIA Continues on Atorvastatin 10 MG daily. Hyperlipidemia status: good compliance Satisfied with current treatment?  yes Side effects:  no Medication compliance: good compliance Past cholesterol meds: Atorvastatin Supplements: fish oil Aspirin:  yes The 10-year ASCVD risk score Mikey Bussing DC Jr., et al., 2013) is: 13.4%   Values used to calculate the score:     Age: 56 years     Sex: Male     Is Non-Hispanic African American: No     Diabetic: No     Tobacco smoker: Yes     Systolic Blood Pressure: 123XX123 mmHg     Is BP treated: No     HDL Cholesterol: 33 mg/dL     Total Cholesterol: 154 mg/dL Chest pain:  no Coronary artery disease:  no Family history CAD:  no Family history early CAD:  no  Functional Status Survey: Is the patient deaf or have difficulty hearing?: No Does the patient have difficulty seeing, even when wearing glasses/contacts?: No Does the patient have difficulty concentrating, remembering, or making decisions?: No Does the patient have difficulty walking or climbing stairs?: No Does the patient have difficulty dressing or bathing?: No Does the patient have difficulty doing errands alone such as visiting a doctor's office or shopping?: No  FALL RISK: Fall Risk  09/18/2019 05/24/2017  Falls in the past year? 0 No  Number falls in past yr: 0 -  Injury with Fall? 0 -    Depression Screen Depression screen [facility] 2/9 09/18/2019 05/24/2018  05/24/2017 03/03/2016  Decreased Interest 0 0 0 0  Down, Depressed, Hopeless 0 0 0 1  PHQ - 2 Score 0 0 0 1  Altered sleeping 0 0 - 0  Tired, decreased energy 0 3 - 0  Change in appetite 0 0 - 0  Feeling bad or failure about yourself  0 0 - 0  Trouble concentrating 0 0 - 0  Moving slowly or fidgety/restless 0 0 - 0  Suicidal thoughts 0 0 - 0  PHQ-9 Score 0 3 - 1    Advanced Directives <no information>  Past Medical History:  Past Medical History:  Diagnosis Date  . ED (erectile dysfunction)   . Gilbert's disease   . Hyperlipidemia   . Myopia   . Obesity   . Progressive iris atrophy of right eye   . Vitreous degeneration of right eye     Surgical History:  Past Surgical History:  Procedure Laterality Date  . CARDIAC CATHETERIZATION    . COLONOSCOPY W/ POLYPECTOMY  2012  . COLONOSCOPY WITH PROPOFOL N/A 10/16/2016   Procedure: COLONOSCOPY WITH PROPOFOL;  Surgeon: Jonathon Bellows, MD;  Location: [facility] ENDOSCOPY;  Service: Endoscopy;  Laterality: N/A;  . EYE SURGERY      Medications:  Current Outpatient Medications on File Prior to Visit  Medication Sig  . ASPIRIN 81 PO Take by mouth daily.  Marland Kitchen  atorvastatin (LIPITOR) 10 MG tablet Take 1 tablet (10 mg total) by mouth at bedtime.  . meloxicam (MOBIC) 15 MG tablet Take 1 tablet by mouth once daily  . Multiple Vitamins tablet Take by mouth.  . Omega-3 Fatty Acids (FISH OIL BURP-LESS PO) Take by mouth.   No current facility-administered medications on file prior to visit.     Allergies:  No Known Allergies  Social History:  Social History   Socioeconomic History  . Marital status: Married    Spouse name: Not on file  . Number of children: Not on file  . Years of education: Not on file  . Highest education level: Not on file  Occupational History  . Not on file  Social Needs  . Financial resource strain: Not on file  . Food insecurity    Worry: Not on file    Inability: Not on file  . Transportation needs    Medical:  Not on file    Non-medical: Not on file  Tobacco Use  . Smoking status: Current Some Day Smoker    Types: Cigars  . Smokeless tobacco: Former Systems developer    Types: Snuff  Substance and Sexual Activity  . Alcohol use: No  . Drug use: No  . Sexual activity: Not on file  Lifestyle  . Physical activity    Days per week: Not on file    Minutes per session: Not on file  . Stress: Not on file  Relationships  . Social Herbalist on phone: Not on file    Gets together: Not on file    Attends religious service: Not on file    Active member of club or organization: Not on file    Attends meetings of clubs or organizations: Not on file    Relationship status: Not on file  . Intimate partner violence    Fear of current or ex partner: Not on file    Emotionally abused: Not on file    Physically abused: Not on file    Forced sexual activity: Not on file  Other Topics Concern  . Not on file  Social History Narrative  . Not on file   Social History   Tobacco Use  Smoking Status Current Some Day Smoker  . Types: Cigars  Smokeless Tobacco Former Systems developer  . Types: Snuff   Social History   Substance and Sexual Activity  Alcohol Use No    Family History:  Family History  Problem Relation Age of Onset  . Cancer Mother   . Cancer Father   . Heart disease Father     Past medical history, surgical history, medications, allergies, family history and social history reviewed with patient today and changes made to appropriate areas of the chart.   Review of Systems - negative All other ROS negative except what is listed above and in the HPI.      Objective:    BP 126/82 (BP Location: Left Arm, Patient Position: Sitting)   Pulse (!) 55   Temp 98 F (36.7 C) (Oral)   Ht 5' 9.7" (1.77 m)   Wt 265 lb (120.2 kg)   SpO2 96%   BMI 38.35 kg/m   Wt Readings from Last 3 Encounters:  09/18/19 265 lb (120.2 kg)  05/24/18 253 lb (114.8 kg)  05/24/17 255 lb (115.7 kg)    Physical  Exam Vitals signs and nursing note reviewed.  Constitutional:      General: He is awake. He is  not in acute distress.    Appearance: He is well-developed. He is obese. He is not ill-appearing.  HENT:     Head: Normocephalic and atraumatic.     Right Ear: Hearing, tympanic membrane, ear canal and external ear normal. No drainage.     Left Ear: Hearing, tympanic membrane, ear canal and external ear normal. No drainage.     Nose: Nose normal.     Mouth/Throat:     Mouth: Mucous membranes are moist.     Pharynx: Uvula midline.  Eyes:     General: Lids are normal.        Right eye: No discharge.        Left eye: No discharge.     Extraocular Movements: Extraocular movements intact.     Conjunctiva/sclera: Conjunctivae normal.     Pupils: Pupils are equal, round, and reactive to light.     Visual Fields: Right eye visual fields normal and left eye visual fields normal.     Comments: Anisocoria R>L.  Neck:     Musculoskeletal: Normal range of motion and neck supple.     Thyroid: No thyromegaly.     Vascular: No carotid bruit.  Cardiovascular:     Rate and Rhythm: Normal rate and regular rhythm.     Heart sounds: Normal heart sounds, S1 normal and S2 normal. No murmur. No gallop.   Pulmonary:     Effort: Pulmonary effort is normal. No accessory muscle usage or respiratory distress.     Breath sounds: Normal breath sounds.  Abdominal:     General: Bowel sounds are normal.     Palpations: Abdomen is soft. There is no hepatomegaly or splenomegaly.     Tenderness: There is no abdominal tenderness.  Genitourinary:    Comments: Deferred per patient request. Musculoskeletal: Normal range of motion.     Right lower leg: No edema.     Left lower leg: No edema.  Lymphadenopathy:     Head:     Right side of head: No submental, submandibular, tonsillar, preauricular or posterior auricular adenopathy.     Left side of head: No submental, submandibular, tonsillar, preauricular or posterior  auricular adenopathy.     Cervical: No cervical adenopathy.  Skin:    General: Skin is warm and dry.     Capillary Refill: Capillary refill takes less than 2 seconds.     Findings: No rash.  Neurological:     Mental Status: He is alert and oriented to person, place, and time.     Cranial Nerves: Cranial nerves are intact.     Gait: Gait is intact.     Deep Tendon Reflexes: Reflexes are normal and symmetric.     Reflex Scores:      Brachioradialis reflexes are 2+ on the right side and 2+ on the left side.      Patellar reflexes are 2+ on the right side and 2+ on the left side. Psychiatric:        Attention and Perception: Attention normal.        Mood and Affect: Mood normal.        Speech: Speech normal.        Behavior: Behavior normal. Behavior is cooperative.        Thought Content: Thought content normal.        Judgment: Judgment normal.     Results for orders placed or performed in visit on 05/24/18  Microscopic Examination   URINE  Result Value Ref Range  WBC, UA 0-5 0 - 5 /hpf   RBC, UA 0-2 0 - 2 /hpf   Epithelial Cells (non renal) 0-10 0 - 10 /hpf   Bacteria, UA None seen None seen/Few  CBC with Differential/Platelet  Result Value Ref Range   WBC 7.1 3.4 - 10.8 x10E3/uL   RBC 5.16 4.14 - 5.80 x10E6/uL   Hemoglobin 15.9 13.0 - 17.7 g/dL   Hematocrit 46.7 37.5 - 51.0 %   MCV 91 79 - 97 fL   MCH 30.8 26.6 - 33.0 pg   MCHC 34.0 31.5 - 35.7 g/dL   RDW 14.0 12.3 - 15.4 %   Platelets 196 150 - 450 x10E3/uL   Neutrophils 41 Not Estab. %   Lymphs 44 Not Estab. %   Monocytes 12 Not Estab. %   Eos 2 Not Estab. %   Basos 1 Not Estab. %   Neutrophils Absolute 2.9 1.4 - 7.0 x10E3/uL   Lymphocytes Absolute 3.2 (H) 0.7 - 3.1 x10E3/uL   Monocytes Absolute 0.8 0.1 - 0.9 x10E3/uL   EOS (ABSOLUTE) 0.2 0.0 - 0.4 x10E3/uL   Basophils Absolute 0.1 0.0 - 0.2 x10E3/uL   Immature Granulocytes 0 Not Estab. %   Immature Grans (Abs) 0.0 0.0 - 0.1 x10E3/uL  Comprehensive metabolic  panel  Result Value Ref Range   Glucose 78 65 - 99 mg/dL   BUN 16 6 - 24 mg/dL   Creatinine, Ser 1.03 0.76 - 1.27 mg/dL   GFR calc non Af Amer 80 >59 mL/min/1.73   GFR calc Af Amer 93 >59 mL/min/1.73   BUN/Creatinine Ratio 16 9 - 20   Sodium 139 134 - 144 mmol/L   Potassium 4.3 3.5 - 5.2 mmol/L   Chloride 100 96 - 106 mmol/L   CO2 22 20 - 29 mmol/L   Calcium 9.3 8.7 - 10.2 mg/dL   Total Protein 7.2 6.0 - 8.5 g/dL   Albumin 4.9 3.5 - 5.5 g/dL   Globulin, Total 2.3 1.5 - 4.5 g/dL   Albumin/Globulin Ratio 2.1 1.2 - 2.2   Bilirubin Total 1.4 (H) 0.0 - 1.2 mg/dL   Alkaline Phosphatase 64 39 - 117 IU/L   AST 30 0 - 40 IU/L   ALT 42 0 - 44 IU/L  Lipid panel  Result Value Ref Range   Cholesterol, Total 154 100 - 199 mg/dL   Triglycerides 295 (H) 0 - 149 mg/dL   HDL 33 (L) >39 mg/dL   VLDL Cholesterol Cal 59 (H) 5 - 40 mg/dL   LDL Calculated 62 0 - 99 mg/dL   Chol/HDL Ratio 4.7 0.0 - 5.0 ratio  PSA  Result Value Ref Range   Prostate Specific Ag, Serum 0.7 0.0 - 4.0 ng/mL  TSH  Result Value Ref Range   TSH 2.650 0.450 - 4.500 uIU/mL  Urinalysis, Routine w reflex microscopic  Result Value Ref Range   Specific Gravity, UA 1.025 1.005 - 1.030   pH, UA 5.0 5.0 - 7.5   Color, UA Orange Yellow   Appearance Ur Clear Clear   Leukocytes, UA Negative Negative   Protein, UA Negative Negative/Trace   Glucose, UA Negative Negative   Ketones, UA Negative Negative   RBC, UA Trace (A) Negative   Bilirubin, UA Negative Negative   Urobilinogen, Ur 0.2 0.2 - 1.0 mg/dL   Nitrite, UA Negative Negative   Microscopic Examination See below:   HIV antibody (with reflex)  Result Value Ref Range   HIV Screen 4th Generation wRfx Non Reactive Non  Reactive      Assessment & Plan:   Problem List Items Addressed This Visit      Other   Hyperlipidemia    Chronic, ongoing.  Recheck lipid panel and CMP today.  Continue current medication regimen and adjust as needed.        Relevant Medications    ASPIRIN 81 PO   Other Relevant Orders   Comprehensive metabolic panel   Lipid Panel w/o Chol/HDL Ratio out   Obesity    Recommend continued focus on health diet choices and regular physical activity (30 minutes 5 days a week).        Other Visit Diagnoses    Annual physical exam    -  Primary   Relevant Orders   CBC with Differential/Platelet out   TSH   PSA   Nicotine dependence, cigarettes, uncomplicated       Relevant Orders   Ambulatory Referral for Lung Cancer Scre       Discussed aspirin prophylaxis for myocardial infarction prevention and decision was made to continue ASA  LABORATORY TESTING:  Health maintenance labs ordered today as discussed above.   The natural history of prostate cancer and ongoing controversy regarding screening and potential treatment outcomes of prostate cancer has been discussed with the patient. The meaning of a false positive PSA and a false negative PSA has been discussed. He indicates understanding of the limitations of this screening test and wishes to proceed with screening PSA testing.   IMMUNIZATIONS:   - Tdap: Tetanus vaccination status reviewed: last tetanus booster within 10 years. - Influenza: Up to date - Pneumovax: Not applicable - Prevnar: Not applicable - Zostavax vaccine: Administered today  SCREENING: - Colonoscopy: Up to date  Discussed with patient purpose of the colonoscopy is to detect colon cancer at curable precancerous or early stages   - AAA Screening: Not applicable  -Hearing Test: not applicable -Spirometry: Not applicable   PATIENT COUNSELING:    Sexuality: Discussed sexually transmitted diseases, partner selection, use of condoms, avoidance of unintended pregnancy  and contraceptive alternatives.   Advised to avoid cigarette smoking.  I discussed with the patient that most people either abstain from alcohol or drink within safe limits (<=14/week and <=4 drinks/occasion for males, <=7/weeks and <= 3  drinks/occasion for females) and that the risk for alcohol disorders and other health effects rises proportionally with the number of drinks per week and how often a drinker exceeds daily limits.  Discussed cessation/primary prevention of drug use and availability of treatment for abuse.   Diet: Encouraged to adjust caloric intake to maintain  or achieve ideal body weight, to reduce intake of dietary saturated fat and total fat, to limit sodium intake by avoiding high sodium foods and not adding table salt, and to maintain adequate dietary potassium and calcium preferably from fresh fruits, vegetables, and low-fat dairy products.    stressed the importance of regular exercise  Injury prevention: Discussed safety belts, safety helmets, smoke detector, smoking near bedding or upholstery.   Dental health: Discussed importance of regular tooth brushing, flossing, and dental visits.   Follow up plan: NEXT PREVENTATIVE PHYSICAL DUE IN 1 YEAR. Return in about 6 months (around 03/17/2020) for HLD and 2nd Shingrix.

## 2019-09-18 NOTE — Patient Instructions (Signed)
Fat and Cholesterol Restricted Eating Plan Getting too much fat and cholesterol in your diet may cause health problems. Choosing the right foods helps keep your fat and cholesterol at normal levels. This can keep you from getting certain diseases. Your doctor may recommend an eating plan that includes:  Total fat: ______% or less of total calories a day.  Saturated fat: ______% or less of total calories a day.  Cholesterol: less than _________mg a day.  Fiber: ______g a day. What are tips for following this plan? Meal planning  At meals, divide your plate into four equal parts: ? Fill one-half of your plate with vegetables and green salads. ? Fill one-fourth of your plate with whole grains. ? Fill one-fourth of your plate with low-fat (lean) protein foods.  Eat fish that is high in omega-3 fats at least two times a week. This includes mackerel, tuna, sardines, and salmon.  Eat foods that are high in fiber, such as whole grains, beans, apples, broccoli, carrots, peas, and barley. General tips   Work with your doctor to lose weight if you need to.  Avoid: ? Foods with added sugar. ? Fried foods. ? Foods with partially hydrogenated oils.  Limit alcohol intake to no more than 1 drink a day for nonpregnant women and 2 drinks a day for men. One drink equals 12 oz of beer, 5 oz of wine, or 1 oz of hard liquor. Reading food labels  Check food labels for: ? Trans fats. ? Partially hydrogenated oils. ? Saturated fat (g) in each serving. ? Cholesterol (mg) in each serving. ? Fiber (g) in each serving.  Choose foods with healthy fats, such as: ? Monounsaturated fats. ? Polyunsaturated fats. ? Omega-3 fats.  Choose grain products that have whole grains. Look for the word "whole" as the first word in the ingredient list. Cooking  Cook foods using low-fat methods. These include baking, boiling, grilling, and broiling.  Eat more home-cooked foods. Eat at restaurants and buffets  less often.  Avoid cooking using saturated fats, such as butter, cream, palm oil, palm kernel oil, and coconut oil. Recommended foods  Fruits  All fresh, canned (in natural juice), or frozen fruits. Vegetables  Fresh or frozen vegetables (raw, steamed, roasted, or grilled). Green salads. Grains  Whole grains, such as whole wheat or whole grain breads, crackers, cereals, and pasta. Unsweetened oatmeal, bulgur, barley, quinoa, or brown rice. Corn or whole wheat flour tortillas. Meats and other protein foods  Ground beef (85% or leaner), grass-fed beef, or beef trimmed of fat. Skinless chicken or turkey. Ground chicken or turkey. Pork trimmed of fat. All fish and seafood. Egg whites. Dried beans, peas, or lentils. Unsalted nuts or seeds. Unsalted canned beans. Nut butters without added sugar or oil. Dairy  Low-fat or nonfat dairy products, such as skim or 1% milk, 2% or reduced-fat cheeses, low-fat and fat-free ricotta or cottage cheese, or plain low-fat and nonfat yogurt. Fats and oils  Tub margarine without trans fats. Light or reduced-fat mayonnaise and salad dressings. Avocado. Olive, canola, sesame, or safflower oils. The items listed above may not be a complete list of foods and beverages you can eat. Contact a dietitian for more information. Foods to avoid Fruits  Canned fruit in heavy syrup. Fruit in cream or butter sauce. Fried fruit. Vegetables  Vegetables cooked in cheese, cream, or butter sauce. Fried vegetables. Grains  White bread. White pasta. White rice. Cornbread. Bagels, pastries, and croissants. Crackers and snack foods that contain trans fat   and hydrogenated oils. Meats and other protein foods  Fatty cuts of meat. Ribs, chicken wings, bacon, sausage, bologna, salami, chitterlings, fatback, hot dogs, bratwurst, and packaged lunch meats. Liver and organ meats. Whole eggs and egg yolks. Chicken and turkey with skin. Fried meat. Dairy  Whole or 2% milk, cream,  half-and-half, and cream cheese. Whole milk cheeses. Whole-fat or sweetened yogurt. Full-fat cheeses. Nondairy creamers and whipped toppings. Processed cheese, cheese spreads, and cheese curds. Beverages  Alcohol. Sugar-sweetened drinks such as sodas, lemonade, and fruit drinks. Fats and oils  Butter, stick margarine, lard, shortening, ghee, or bacon fat. Coconut, palm kernel, and palm oils. Sweets and desserts  Corn syrup, sugars, honey, and molasses. Candy. Jam and jelly. Syrup. Sweetened cereals. Cookies, pies, cakes, donuts, muffins, and ice cream. The items listed above may not be a complete list of foods and beverages you should avoid. Contact a dietitian for more information. Summary  Choosing the right foods helps keep your fat and cholesterol at normal levels. This can keep you from getting certain diseases.  At meals, fill one-half of your plate with vegetables and green salads.  Eat high-fiber foods, like whole grains, beans, apples, carrots, peas, and barley.  Limit added sugar, saturated fats, alcohol, and fried foods. This information is not intended to replace advice given to you by your health care provider. Make sure you discuss any questions you have with your health care provider. Document Released: 04/19/2012 Document Revised: 06/22/2018 Document Reviewed: 07/06/2017 Elsevier Patient Education  2020 Elsevier Inc.  

## 2019-09-19 ENCOUNTER — Telehealth: Payer: Self-pay | Admitting: *Deleted

## 2019-09-19 DIAGNOSIS — Z122 Encounter for screening for malignant neoplasm of respiratory organs: Secondary | ICD-10-CM

## 2019-09-19 DIAGNOSIS — Z87891 Personal history of nicotine dependence: Secondary | ICD-10-CM

## 2019-09-19 LAB — COMPREHENSIVE METABOLIC PANEL
ALT: 61 IU/L — ABNORMAL HIGH (ref 0–44)
AST: 45 IU/L — ABNORMAL HIGH (ref 0–40)
Albumin/Globulin Ratio: 2.4 — ABNORMAL HIGH (ref 1.2–2.2)
Albumin: 4.7 g/dL (ref 3.8–4.9)
Alkaline Phosphatase: 66 IU/L (ref 39–117)
BUN/Creatinine Ratio: 13 (ref 9–20)
BUN: 12 mg/dL (ref 6–24)
Bilirubin Total: 1.3 mg/dL — ABNORMAL HIGH (ref 0.0–1.2)
CO2: 22 mmol/L (ref 20–29)
Calcium: 9.2 mg/dL (ref 8.7–10.2)
Chloride: 104 mmol/L (ref 96–106)
Creatinine, Ser: 0.9 mg/dL (ref 0.76–1.27)
GFR calc Af Amer: 108 mL/min/{1.73_m2} (ref >59)
GFR calc non Af Amer: 93 mL/min/{1.73_m2} (ref >59)
Globulin, Total: 2 g/dL (ref 1.5–4.5)
Glucose: 87 mg/dL (ref 65–99)
Potassium: 4.6 mmol/L (ref 3.5–5.2)
Sodium: 140 mmol/L (ref 134–144)
Total Protein: 6.7 g/dL (ref 6.0–8.5)

## 2019-09-19 LAB — LIPID PANEL W/O CHOL/HDL RATIO
Cholesterol, Total: 137 mg/dL (ref 100–199)
HDL: 35 mg/dL — ABNORMAL LOW (ref >39)
LDL Chol Calc (NIH): 58 mg/dL (ref 0–99)
Triglycerides: 282 mg/dL — ABNORMAL HIGH (ref 0–149)
VLDL Cholesterol Cal: 44 mg/dL — ABNORMAL HIGH (ref 5–40)

## 2019-09-19 LAB — CBC WITH DIFFERENTIAL/PLATELET
Basophils Absolute: 0.1 10*3/uL (ref 0.0–0.2)
Basos: 1 %
EOS (ABSOLUTE): 0.1 10*3/uL (ref 0.0–0.4)
Eos: 2 %
Hematocrit: 45.6 % (ref 37.5–51.0)
Hemoglobin: 15.7 g/dL (ref 13.0–17.7)
Immature Grans (Abs): 0 10*3/uL (ref 0.0–0.1)
Immature Granulocytes: 0 %
Lymphocytes Absolute: 2.4 10*3/uL (ref 0.7–3.1)
Lymphs: 40 %
MCH: 31.1 pg (ref 26.6–33.0)
MCHC: 34.4 g/dL (ref 31.5–35.7)
MCV: 90 fL (ref 79–97)
Monocytes Absolute: 0.6 10*3/uL (ref 0.1–0.9)
Monocytes: 11 %
Neutrophils Absolute: 2.8 10*3/uL (ref 1.4–7.0)
Neutrophils: 46 %
Platelets: 173 10*3/uL (ref 150–450)
RBC: 5.05 x10E6/uL (ref 4.14–5.80)
RDW: 13 % (ref 11.6–15.4)
WBC: 6 10*3/uL (ref 3.4–10.8)

## 2019-09-19 LAB — TSH: TSH: 2.03 u[IU]/mL (ref 0.450–4.500)

## 2019-09-19 LAB — PSA: Prostate Specific Ag, Serum: 0.5 ng/mL (ref 0.0–4.0)

## 2019-09-19 NOTE — Telephone Encounter (Signed)
Received referral for low dose lung cancer screening CT scan. Message left at phone number listed in EMR for patient to call me back to facilitate scheduling scan.  

## 2019-09-19 NOTE — Telephone Encounter (Signed)
Received referral for initial lung cancer screening scan. Contacted patient and obtained smoking history,(current, 94 pack year) as well as answering questions related to screening process. Patient denies signs of lung cancer such as weight loss or hemoptysis. Patient denies comorbidity that would prevent curative treatment if lung cancer were found. Patient is scheduled for shared decision making visit and CT scan on (date tbd).

## 2019-09-25 ENCOUNTER — Inpatient Hospital Stay: Payer: BC Managed Care – PPO | Attending: Oncology | Admitting: Oncology

## 2019-09-25 ENCOUNTER — Other Ambulatory Visit: Payer: Self-pay

## 2019-09-25 DIAGNOSIS — Z87891 Personal history of nicotine dependence: Secondary | ICD-10-CM | POA: Diagnosis not present

## 2019-09-25 DIAGNOSIS — E785 Hyperlipidemia, unspecified: Secondary | ICD-10-CM

## 2019-09-25 MED ORDER — MELOXICAM 15 MG PO TABS: 15.0000 mg | ORAL_TABLET | Freq: Every day | ORAL | 1 refills | Status: DC

## 2019-09-25 MED ORDER — ATORVASTATIN CALCIUM 10 MG PO TABS: 10.0000 mg | ORAL_TABLET | Freq: Every day | ORAL | 4 refills | Status: DC

## 2019-09-25 NOTE — Telephone Encounter (Signed)
Patient last seen 09/18/19 

## 2019-09-25 NOTE — Progress Notes (Signed)
Virtual Visit via Video Note  I connected with Willie Williams on 09/25/19 at  4:30 PM EST by a video enabled telemedicine application and verified that I am speaking with the correct person using two identifiers.  Location: Patient: Home Provider: Office   I discussed the limitations of evaluation and management by telemedicine and the availability of in person appointments. The patient expressed understanding and agreed to proceed.  I discussed the assessment and treatment plan with the patient. The patient was provided an opportunity to ask questions and all were answered. The patient agreed with the plan and demonstrated an understanding of the instructions.   The patient was advised to call back or seek an in-person evaluation if the symptoms worsen or if the condition fails to improve as anticipated.   In accordance with CMS guidelines, patient has met eligibility criteria including age, absence of signs or symptoms of lung cancer.  Social History   Tobacco Use  . Smoking status: Current Some Day Smoker    Types: Cigars  . Smokeless tobacco: Former Systems developer    Types: Snuff  Substance Use Topics  . Alcohol use: No  . Drug use: No      A shared decision-making session was conducted prior to the performance of CT scan. This includes one or more decision aids, includes benefits and harms of screening, follow-up diagnostic testing, over-diagnosis, false positive rate, and total radiation exposure.   Counseling on the importance of adherence to annual lung cancer LDCT screening, impact of co-morbidities, and ability or willingness to undergo diagnosis and treatment is imperative for compliance of the program.   Counseling on the importance of continued smoking cessation for former smokers; the importance of smoking cessation for current smokers, and information about tobacco cessation interventions have been given to patient including [place] and [facility] quit  programs.    Written order for lung cancer screening with LDCT has been given to the patient and any and all questions have been answered to the best of my abilities.    Yearly follow up will be coordinated by Burgess Estelle, Thoracic Navigator.  I provided 15 minutes of face-to-face video visit time during this encounter, and > 50% was spent counseling as documented under my assessment & plan.   Jacquelin Hawking, NP

## 2019-10-02 ENCOUNTER — Other Ambulatory Visit: Payer: Self-pay

## 2019-10-02 ENCOUNTER — Ambulatory Visit
Admission: RE | Admit: 2019-10-02 | Discharge: 2019-10-02 | Disposition: A | Discharge status: Home / Self Care | Payer: BC Managed Care – PPO | Source: Ambulatory Visit | Attending: Oncology | Admitting: Oncology

## 2019-10-02 DIAGNOSIS — Z87891 Personal history of nicotine dependence: Secondary | ICD-10-CM | POA: Diagnosis not present

## 2019-10-02 DIAGNOSIS — Z122 Encounter for screening for malignant neoplasm of respiratory organs: Secondary | ICD-10-CM

## 2019-10-02 DIAGNOSIS — F17210 Nicotine dependence, cigarettes, uncomplicated: Secondary | ICD-10-CM | POA: Diagnosis not present

## 2019-10-04 ENCOUNTER — Encounter: Payer: Self-pay | Admitting: *Deleted

## 2019-10-04 ENCOUNTER — Encounter: Payer: Self-pay | Admitting: Nurse Practitioner

## 2019-10-04 DIAGNOSIS — R911 Solitary pulmonary nodule: Secondary | ICD-10-CM | POA: Insufficient documentation

## 2019-10-04 DIAGNOSIS — I700 Atherosclerosis of aorta: Secondary | ICD-10-CM | POA: Insufficient documentation

## 2019-10-04 DIAGNOSIS — IMO0001 Reserved for inherently not codable concepts without codable children: Secondary | ICD-10-CM | POA: Insufficient documentation

## 2019-10-04 DIAGNOSIS — J439 Emphysema, unspecified: Secondary | ICD-10-CM | POA: Insufficient documentation

## 2019-11-09 DIAGNOSIS — Z20828 Contact with and (suspected) exposure to other viral communicable diseases: Secondary | ICD-10-CM | POA: Diagnosis not present

## 2019-12-29 ENCOUNTER — Telehealth: Payer: Self-pay

## 2019-12-29 NOTE — Telephone Encounter (Signed)
Returned patient's call to schedule his 2nd dose shingrix vaccine. LVM for patient to return call to schedule.  Copied from [place] [phone]. Topic: General - Other >> Dec 28, 2019  4:46 PM Leward Quan A wrote: Reason for CRM: Patient called to say that he received his first Shingrix vaccine on 09/18/2019 but was never scheduled for the second vaccine and he would like a call back to schedule if he is not out of compliance with the date. Please call Ph# [phone] >> Dec 29, 2019  8:56 AM Sandria Manly, [place] wrote: Tried calling patient. No answer. LVM for patient to return phone call.  Patient can schedule a nurse visit. He is not out of compliance.  Needs 2nd dose 6 months from 1st. He wouldn't be out of range till May.

## 2020-03-18 ENCOUNTER — Encounter: Payer: Self-pay | Admitting: Nurse Practitioner

## 2020-03-18 ENCOUNTER — Ambulatory Visit (INDEPENDENT_AMBULATORY_CARE_PROVIDER_SITE_OTHER): Payer: BC Managed Care – PPO | Admitting: Nurse Practitioner

## 2020-03-18 ENCOUNTER — Other Ambulatory Visit: Payer: Self-pay

## 2020-03-18 VITALS — BP 124/80 | HR 61 | Temp 97.7°F | Ht 70.16 in | Wt 269.1 lb

## 2020-03-18 DIAGNOSIS — I700 Atherosclerosis of aorta: Secondary | ICD-10-CM | POA: Diagnosis not present

## 2020-03-18 DIAGNOSIS — Z23 Encounter for immunization: Secondary | ICD-10-CM

## 2020-03-18 DIAGNOSIS — Z713 Dietary counseling and surveillance: Secondary | ICD-10-CM | POA: Diagnosis not present

## 2020-03-18 DIAGNOSIS — F17210 Nicotine dependence, cigarettes, uncomplicated: Secondary | ICD-10-CM

## 2020-03-18 DIAGNOSIS — E782 Mixed hyperlipidemia: Secondary | ICD-10-CM | POA: Diagnosis not present

## 2020-03-18 DIAGNOSIS — R911 Solitary pulmonary nodule: Secondary | ICD-10-CM

## 2020-03-18 DIAGNOSIS — E6609 Other obesity due to excess calories: Secondary | ICD-10-CM | POA: Diagnosis not present

## 2020-03-18 DIAGNOSIS — IMO0001 Reserved for inherently not codable concepts without codable children: Secondary | ICD-10-CM

## 2020-03-18 DIAGNOSIS — J432 Centrilobular emphysema: Secondary | ICD-10-CM

## 2020-03-18 DIAGNOSIS — Z6838 Body mass index (BMI) 38.0-38.9, adult: Secondary | ICD-10-CM

## 2020-03-18 NOTE — Progress Notes (Signed)
BP 124/80 (BP Location: Left Arm)   Pulse 61   Temp 97.7 F (36.5 C)   Ht 5' 10.16" (1.782 m)   Wt 269 lb 2 oz (122.1 kg)   SpO2 96%   BMI 38.44 kg/m    Subjective:    Patient ID: Willie Williams, male    DOB: 10-Feb-1961, 59 y.o.   MRN: [id number]  HPI: Willie Williams is a 59 y.o. male  Chief Complaint  Patient presents with  . Hyperlipidemia   HYPERLIPIDEMIA Continues on Atorvastatin 10 MG daily. Hyperlipidemia status: good compliance Satisfied with current treatment?  yes Side effects:  no Medication compliance: good compliance Past cholesterol meds: Atorvastatin Supplements: none Aspirin:  yes The 10-year ASCVD risk score Mikey Bussing DC Jr., et al., 2013) is: 11.2%   Values used to calculate the score:     Age: 59 years     Sex: Male     Is Non-Hispanic African American: No     Diabetic: No     Tobacco smoker: Yes     Systolic Blood Pressure: [id number] mmHg     Is BP treated: No     HDL Cholesterol: 35 mg/dL     Total Cholesterol: 137 mg/dL Chest pain:  no Coronary artery disease:  no Family history CAD:  no Family history early CAD:  no   COPD Noted on CT scan in December 2021.  He only smokes an occasional cigar. At one time smoked pretty heavy.  CT noted aortic atherosclerosis, he continues statin and ASA.  Currently not using any inhalers. COPD status: stable Satisfied with current treatment?: yes Oxygen use: no Dyspnea frequency: none Cough frequency: occasional Rescue inhaler frequency: none used Limitation of activity: no Productive cough: none Last Spirometry: no recent Pneumovax: Not up to Date Influenza: Up to Date  Relevant past medical, surgical, family and social history reviewed and updated as indicated. Interim medical history since our last visit reviewed. Allergies and medications reviewed and updated.  Review of Systems  Constitutional: Negative for activity change, diaphoresis, fatigue and fever.  Respiratory: Negative for cough, chest  tightness, shortness of breath and wheezing.   Cardiovascular: Negative for chest pain, palpitations and leg swelling.  Gastrointestinal: Negative.   Endocrine: Negative for cold intolerance, heat intolerance, polydipsia, polyphagia and polyuria.  Musculoskeletal: Negative.   Skin: Negative.   Neurological: Negative for dizziness, syncope, weakness, light-headedness, numbness and headaches.  Psychiatric/Behavioral: Negative.     Per HPI unless specifically indicated above     Objective:    BP 124/80 (BP Location: Left Arm)   Pulse 61   Temp 97.7 F (36.5 C)   Ht 5' 10.16" (1.782 m)   Wt 269 lb 2 oz (122.1 kg)   SpO2 96%   BMI 38.44 kg/m   Wt Readings from Last 3 Encounters:  03/18/20 269 lb 2 oz (122.1 kg)  10/02/19 265 lb (120.2 kg)  09/18/19 265 lb (120.2 kg)    Physical Exam Vitals and nursing note reviewed.  Constitutional:      General: He is awake. He is not in acute distress.    Appearance: He is well-developed. He is obese. He is not ill-appearing.  HENT:     Head: Normocephalic and atraumatic.     Right Ear: Hearing normal. No drainage.     Left Ear: Hearing normal. No drainage.  Eyes:     General: Lids are normal.        Right eye: No discharge.  Left eye: No discharge.     Conjunctiva/sclera: Conjunctivae normal.     Pupils: Pupils are equal, round, and reactive to light.  Neck:     Thyroid: No thyromegaly.     Vascular: No carotid bruit.  Cardiovascular:     Rate and Rhythm: Normal rate and regular rhythm.     Heart sounds: Normal heart sounds, S1 normal and S2 normal. No murmur. No gallop.   Pulmonary:     Effort: Pulmonary effort is normal. No accessory muscle usage or respiratory distress.     Breath sounds: Normal breath sounds.  Abdominal:     General: Bowel sounds are normal.     Palpations: Abdomen is soft.  Musculoskeletal:        General: Normal range of motion.     Cervical back: Normal range of motion and neck supple.     Right  lower leg: No edema.     Left lower leg: No edema.  Skin:    General: Skin is warm and dry.     Capillary Refill: Capillary refill takes less than 2 seconds.  Neurological:     Mental Status: He is alert and oriented to person, place, and time.     Deep Tendon Reflexes: Reflexes are normal and symmetric.  Psychiatric:        Attention and Perception: Attention normal.        Mood and Affect: Mood normal.        Speech: Speech normal.        Behavior: Behavior normal. Behavior is cooperative.        Thought Content: Thought content normal.     Results for orders placed or performed in visit on 09/18/19  CBC with Differential/Platelet out  Result Value Ref Range   WBC 6.0 3.4 - 10.8 x10E3/uL   RBC 5.05 4.14 - 5.80 x10E6/uL   Hemoglobin 15.7 13.0 - 17.7 g/dL   Hematocrit 45.6 37.5 - 51.0 %   MCV 90 79 - 97 fL   MCH 31.1 26.6 - 33.0 pg   MCHC 34.4 31.5 - 35.7 g/dL   RDW 13.0 11.6 - 15.4 %   Platelets 173 150 - 450 x10E3/uL   Neutrophils 46 Not Estab. %   Lymphs 40 Not Estab. %   Monocytes 11 Not Estab. %   Eos 2 Not Estab. %   Basos 1 Not Estab. %   Neutrophils Absolute 2.8 1.4 - 7.0 x10E3/uL   Lymphocytes Absolute 2.4 0.7 - 3.1 x10E3/uL   Monocytes Absolute 0.6 0.1 - 0.9 x10E3/uL   EOS (ABSOLUTE) 0.1 0.0 - 0.4 x10E3/uL   Basophils Absolute 0.1 0.0 - 0.2 x10E3/uL   Immature Granulocytes 0 Not Estab. %   Immature Grans (Abs) 0.0 0.0 - 0.1 x10E3/uL  Comprehensive metabolic panel  Result Value Ref Range   Glucose 87 65 - 99 mg/dL   BUN 12 6 - 24 mg/dL   Creatinine, Ser 0.90 0.76 - 1.27 mg/dL   GFR calc non Af Amer 93 >59 mL/min/1.73   GFR calc Af Amer 108 >59 mL/min/1.73   BUN/Creatinine Ratio 13 9 - 20   Sodium 140 134 - 144 mmol/L   Potassium 4.6 3.5 - 5.2 mmol/L   Chloride 104 96 - 106 mmol/L   CO2 22 20 - 29 mmol/L   Calcium 9.2 8.7 - 10.2 mg/dL   Total Protein 6.7 6.0 - 8.5 g/dL   Albumin 4.7 3.8 - 4.9 g/dL   Globulin, Total 2.0 1.5 -  4.5 g/dL   Albumin/Globulin  Ratio 2.4 (H) 1.2 - 2.2   Bilirubin Total 1.3 (H) 0.0 - 1.2 mg/dL   Alkaline Phosphatase 66 39 - 117 IU/L   AST 45 (H) 0 - 40 IU/L   ALT 61 (H) 0 - 44 IU/L  Lipid Panel w/o Chol/HDL Ratio out  Result Value Ref Range   Cholesterol, Total 137 100 - 199 mg/dL   Triglycerides 282 (H) 0 - 149 mg/dL   HDL 35 (L) >39 mg/dL   VLDL Cholesterol Cal 44 (H) 5 - 40 mg/dL   LDL Chol Calc (NIH) 58 0 - 99 mg/dL  TSH  Result Value Ref Range   TSH 2.030 0.450 - 4.500 uIU/mL  PSA  Result Value Ref Range   Prostate Specific Ag, Serum 0.5 0.0 - 4.0 ng/mL      Assessment & Plan:   Problem List Items Addressed This Visit      Cardiovascular and Mediastinum   Aortic atherosclerosis ([place])    Noted on lung CT screening, discussed with patient and educated.  Continue statin and ASA daily.  Recommend complete cessation of smoking for prevention.        Respiratory   Emphysema of lung ([place]) - Primary    New diagnosis, noted on CT lung screening December 2020.  No current symptoms or inhalers.  Will plan on spirometry at upcoming visit.  Recommend complete cessation of smoking for prevention and educated on emphysema.  Continue annual screening.  Return in 6 months for physical.      Relevant Orders   Basic metabolic panel     Other   Hyperlipidemia    Chronic, ongoing.  Recheck lipid panel today.  Continue current medication regimen and adjust as needed.        Relevant Orders   Basic metabolic panel   Lipid Panel w/o Chol/HDL Ratio   Obesity    Recommended eating smaller high protein, low fat meals more frequently and exercising 30 mins a day 5 times a week with a goal of 10-15lb weight loss in the next 3 months. Patient voiced their understanding and motivation to adhere to these recommendations.       Lung nodule < 6cm on CT    Noted on December 2020 lung screening, continue annual screening to monitor area.      Nicotine dependence, cigarettes, uncomplicated    Other Visit Diagnoses      Need for shingles vaccine       Relevant Orders   Varicella-zoster vaccine IM (Shingrix)       Follow up plan: Return in about 6 months (around 09/18/2020) for Annual physical.

## 2020-03-18 NOTE — Assessment & Plan Note (Addendum)
New diagnosis, noted on CT lung screening December 2020.  No current symptoms or inhalers.  Will plan on spirometry at upcoming visit.  Recommend complete cessation of smoking for prevention and educated on emphysema.  Continue annual screening.  Return in 6 months for physical.

## 2020-03-18 NOTE — Assessment & Plan Note (Signed)
Chronic, ongoing.  Recheck lipid panel today.  Continue current medication regimen and adjust as needed.   

## 2020-03-18 NOTE — Assessment & Plan Note (Signed)
Recommended eating smaller high protein, low fat meals more frequently and exercising 30 mins a day 5 times a week with a goal of 10-15lb weight loss in the next 3 months. Patient voiced their understanding and motivation to adhere to these recommendations.  

## 2020-03-18 NOTE — Assessment & Plan Note (Signed)
Noted on December 2020 lung screening, continue annual screening to monitor area.

## 2020-03-18 NOTE — Patient Instructions (Signed)
Chronic Obstructive Pulmonary Disease Chronic obstructive pulmonary disease (COPD) is a long-term (chronic) lung problem. When you have COPD, it is hard for air to get in and out of your lungs. Usually the condition gets worse over time, and your lungs will never return to normal. There are things you can do to keep yourself as healthy as possible.  Your doctor may treat your condition with: ? Medicines. ? Oxygen. ? Lung surgery.  Your doctor may also recommend: ? Rehabilitation. This includes steps to make your body work better. It may involve a team of specialists. ? Quitting smoking, if you smoke. ? Exercise and changes to your diet. ? Comfort measures (palliative care). Follow these instructions at home: Medicines  Take over-the-counter and prescription medicines only as told by your doctor.  Talk to your doctor before taking any cough or allergy medicines. You may need to avoid medicines that cause your lungs to be dry. Lifestyle  If you smoke, stop. Smoking makes the problem worse. If you need help quitting, ask your doctor.  Avoid being around things that make your breathing worse. This may include smoke, chemicals, and fumes.  Stay active, but remember to rest as well.  Learn and use tips on how to relax.  Make sure you get enough sleep. Most adults need at least 7 hours of sleep every night.  Eat healthy foods. Eat smaller meals more often. Rest before meals. Controlled breathing Learn and use tips on how to control your breathing as told by your doctor. Try:  Breathing in (inhaling) through your nose for 1 second. Then, pucker your lips and breath out (exhale) through your lips for 2 seconds.  Putting one hand on your belly (abdomen). Breathe in slowly through your nose for 1 second. Your hand on your belly should move out. Pucker your lips and breathe out slowly through your lips. Your hand on your belly should move in as you breathe out.  Controlled coughing Learn  and use controlled coughing to clear mucus from your lungs. Follow these steps: 1. Lean your head a little forward. 2. Breathe in deeply. 3. Try to hold your breath for 3 seconds. 4. Keep your mouth slightly open while coughing 2 times. 5. Spit any mucus out into a tissue. 6. Rest and do the steps again 1 or 2 times as needed. General instructions  Make sure you get all the shots (vaccines) that your doctor recommends. Ask your doctor about a flu shot and a pneumonia shot.  Use oxygen therapy and pulmonary rehabilitation if told by your doctor. If you need home oxygen therapy, ask your doctor if you should buy a tool to measure your oxygen level (oximeter).  Make a COPD action plan with your doctor. This helps you to know what to do if you feel worse than usual.  Manage any other conditions you have as told by your doctor.  Avoid going outside when it is very hot, cold, or humid.  Avoid people who have a sickness you can catch (contagious).  Keep all follow-up visits as told by your doctor. This is important. Contact a doctor if:  You cough up more mucus than usual.  There is a change in the color or thickness of the mucus.  It is harder to breathe than usual.  Your breathing is faster than usual.  You have trouble sleeping.  You need to use your medicines more often than usual.  You have trouble doing your normal activities such as getting dressed   or walking around the house. Get help right away if:  You have shortness of breath while resting.  You have shortness of breath that stops you from: ? Being able to talk. ? Doing normal activities.  Your chest hurts for longer than 5 minutes.  Your skin color is more blue than usual.  Your pulse oximeter shows that you have low oxygen for longer than 5 minutes.  You have a fever.  You feel too tired to breathe normally. Summary  Chronic obstructive pulmonary disease (COPD) is a long-term lung problem.  The way your  lungs work will never return to normal. Usually the condition gets worse over time. There are things you can do to keep yourself as healthy as possible.  Take over-the-counter and prescription medicines only as told by your doctor.  If you smoke, stop. Smoking makes the problem worse. This information is not intended to replace advice given to you by your health care provider. Make sure you discuss any questions you have with your health care provider. Document Revised: 10/01/2017 Document Reviewed: 11/23/2016 Elsevier Patient Education  2020 Elsevier Inc.  

## 2020-03-18 NOTE — Assessment & Plan Note (Signed)
Noted on lung CT screening, discussed with patient and educated.  Continue statin and ASA daily.  Recommend complete cessation of smoking for prevention. 

## 2020-03-19 LAB — LIPID PANEL W/O CHOL/HDL RATIO
Cholesterol, Total: 143 mg/dL (ref 100–199)
HDL: 37 mg/dL — ABNORMAL LOW (ref >39)
LDL Chol Calc (NIH): 62 mg/dL (ref 0–99)
Triglycerides: 278 mg/dL — ABNORMAL HIGH (ref 0–149)
VLDL Cholesterol Cal: 44 mg/dL — ABNORMAL HIGH (ref 5–40)

## 2020-03-19 LAB — BASIC METABOLIC PANEL
BUN/Creatinine Ratio: 13 (ref 9–20)
BUN: 13 mg/dL (ref 6–24)
CO2: 22 mmol/L (ref 20–29)
Calcium: 9 mg/dL (ref 8.7–10.2)
Chloride: 104 mmol/L (ref 96–106)
Creatinine, Ser: 1 mg/dL (ref 0.76–1.27)
GFR calc Af Amer: 95 mL/min/{1.73_m2} (ref >59)
GFR calc non Af Amer: 82 mL/min/{1.73_m2} (ref >59)
Glucose: 90 mg/dL (ref 65–99)
Potassium: 4.8 mmol/L (ref 3.5–5.2)
Sodium: 141 mmol/L (ref 134–144)

## 2020-03-19 NOTE — Progress Notes (Signed)
Please let Willie Williams know his labs have returned.  Kidney function remains stable.  Cholesterol levels continue to show at goal LDL, but triglycerides remain elevated.  Did he eat before labs?  If so this may explain it.  If he did not eat, then we may need to add on another medication to help bring triglycerides down a little more.  Continue to focus on diet at this time and continue Atorvastatin.  Have a great day!!

## 2020-05-23 DIAGNOSIS — Z713 Dietary counseling and surveillance: Secondary | ICD-10-CM | POA: Diagnosis not present

## 2020-08-14 ENCOUNTER — Telehealth: Payer: Self-pay

## 2020-08-14 NOTE — Telephone Encounter (Signed)
Copied from [place] [phone]. Topic: General - Other >> Aug 14, 2020  8:07 AM Keene Breath wrote: Reason for CRM: Patient would like the nurse to call him regarding his Shingrix vaccine.  He is not sure if he got it this year and would like to know.  CB#[phone]

## 2020-08-14 NOTE — Telephone Encounter (Signed)
Called and notified patient that he has had both of his shingrix vaccines.

## 2020-09-20 ENCOUNTER — Other Ambulatory Visit: Payer: Self-pay | Admitting: *Deleted

## 2020-09-20 DIAGNOSIS — Z122 Encounter for screening for malignant neoplasm of respiratory organs: Secondary | ICD-10-CM

## 2020-09-20 DIAGNOSIS — Z87891 Personal history of nicotine dependence: Secondary | ICD-10-CM

## 2020-09-20 NOTE — Progress Notes (Signed)
Contacted and scheduled for lung screening scan. Patient is a current smoker with a 94 pack year history.

## 2020-09-30 ENCOUNTER — Encounter: Payer: BC Managed Care – PPO | Admitting: Nurse Practitioner

## 2020-10-17 ENCOUNTER — Ambulatory Visit: Payer: BC Managed Care – PPO

## 2020-11-05 DIAGNOSIS — Z713 Dietary counseling and surveillance: Secondary | ICD-10-CM | POA: Diagnosis not present

## 2020-11-14 ENCOUNTER — Other Ambulatory Visit: Payer: Self-pay

## 2020-11-14 ENCOUNTER — Ambulatory Visit
Admission: RE | Admit: 2020-11-14 | Discharge: 2020-11-14 | Disposition: A | Discharge status: Home / Self Care | Payer: BC Managed Care – PPO | Source: Ambulatory Visit | Attending: Nurse Practitioner | Admitting: Nurse Practitioner

## 2020-11-14 DIAGNOSIS — Z122 Encounter for screening for malignant neoplasm of respiratory organs: Secondary | ICD-10-CM | POA: Insufficient documentation

## 2020-11-14 DIAGNOSIS — Z87891 Personal history of nicotine dependence: Secondary | ICD-10-CM | POA: Diagnosis not present

## 2020-11-19 ENCOUNTER — Inpatient Hospital Stay: Admission: RE | Admit: 2020-11-19 | Payer: PRIVATE HEALTH INSURANCE | Source: Ambulatory Visit

## 2020-11-20 ENCOUNTER — Encounter: Payer: Self-pay | Admitting: *Deleted

## 2020-12-03 ENCOUNTER — Encounter: Payer: BC Managed Care – PPO | Admitting: Nurse Practitioner

## 2020-12-10 ENCOUNTER — Encounter: Payer: Self-pay | Admitting: Nurse Practitioner

## 2020-12-10 ENCOUNTER — Other Ambulatory Visit: Payer: Self-pay

## 2020-12-10 ENCOUNTER — Ambulatory Visit (INDEPENDENT_AMBULATORY_CARE_PROVIDER_SITE_OTHER): Payer: BC Managed Care – PPO | Admitting: Nurse Practitioner

## 2020-12-10 VITALS — BP 136/88 | HR 58 | Temp 97.8°F | Ht 69.21 in | Wt 274.8 lb

## 2020-12-10 DIAGNOSIS — Z23 Encounter for immunization: Secondary | ICD-10-CM | POA: Diagnosis not present

## 2020-12-10 DIAGNOSIS — Z125 Encounter for screening for malignant neoplasm of prostate: Secondary | ICD-10-CM

## 2020-12-10 DIAGNOSIS — Z0000 Encounter for general adult medical examination without abnormal findings: Secondary | ICD-10-CM

## 2020-12-10 DIAGNOSIS — E782 Mixed hyperlipidemia: Secondary | ICD-10-CM | POA: Diagnosis not present

## 2020-12-10 DIAGNOSIS — I700 Atherosclerosis of aorta: Secondary | ICD-10-CM

## 2020-12-10 DIAGNOSIS — R911 Solitary pulmonary nodule: Secondary | ICD-10-CM

## 2020-12-10 DIAGNOSIS — R030 Elevated blood-pressure reading, without diagnosis of hypertension: Secondary | ICD-10-CM

## 2020-12-10 DIAGNOSIS — Z131 Encounter for screening for diabetes mellitus: Secondary | ICD-10-CM | POA: Diagnosis not present

## 2020-12-10 DIAGNOSIS — Z6841 Body Mass Index (BMI) 40.0 and over, adult: Secondary | ICD-10-CM

## 2020-12-10 DIAGNOSIS — Z1329 Encounter for screening for other suspected endocrine disorder: Secondary | ICD-10-CM | POA: Diagnosis not present

## 2020-12-10 DIAGNOSIS — J432 Centrilobular emphysema: Secondary | ICD-10-CM

## 2020-12-10 DIAGNOSIS — F17210 Nicotine dependence, cigarettes, uncomplicated: Secondary | ICD-10-CM

## 2020-12-10 DIAGNOSIS — IMO0001 Reserved for inherently not codable concepts without codable children: Secondary | ICD-10-CM

## 2020-12-10 LAB — MICROALBUMIN, URINE WAIVED
Creatinine, Urine Waived: 100 mg/dL (ref 10–300)
Microalb, Ur Waived: 10 mg/L (ref 0–19)
Microalb/Creat Ratio: <30 mg/g (ref <30)

## 2020-12-10 MED ORDER — MELOXICAM 15 MG PO TABS: 15.0000 mg | ORAL_TABLET | Freq: Every day | ORAL | 1 refills | Status: DC

## 2020-12-10 MED ORDER — ATORVASTATIN CALCIUM 10 MG PO TABS: 10.0000 mg | ORAL_TABLET | Freq: Every day | ORAL | 4 refills | Status: DC

## 2020-12-10 NOTE — Assessment & Plan Note (Signed)
Chronic, ongoing.  Recheck lipid panel today.  Continue current medication regimen and adjust as needed.

## 2020-12-10 NOTE — Assessment & Plan Note (Signed)
Elevated today on initial check and on repeat remains above goal.  Educated him at length on DASH diet and recommend heavy focus on this.  Recommend she monitor BP at least a few mornings a week at home and document.   Labs today.  Will plan to initiate medication as needed next visit if ongoing elevations.  Return in 6 months.

## 2020-12-10 NOTE — Assessment & Plan Note (Signed)
Ongoing, stable.  Continue to monitor.  CMP today.

## 2020-12-10 NOTE — Assessment & Plan Note (Signed)
BMI 40.33. Recommended eating smaller high protein, low fat meals more frequently and exercising 30 mins a day 5 times a week with a goal of 10-15lb weight loss in the next 3 months. Patient voiced their understanding and motivation to adhere to these recommendations.

## 2020-12-10 NOTE — Assessment & Plan Note (Signed)
Ongoing without symptoms, noted on CT lung screening (mild).  No current inhalers.  Spirometry today with FEV1 84% and FEV1/FVC 100% pre.  Recommend complete cessation of smoking for prevention and educated on emphysema.  Continue annual screening.  Return in 6 months.  Initiate inhalers as needed.

## 2020-12-10 NOTE — Progress Notes (Addendum)
BP 136/88 (BP Location: Left Arm)   Pulse (!) 58   Temp 97.8 F (36.6 C) (Oral)   Ht 5' 9.21" (1.758 m)   Wt 274 lb 12.8 oz (124.6 kg)   SpO2 95%   BMI 40.33 kg/m    Subjective:    Patient ID: Willie Williams, male    DOB: 1961-06-26, 60 y.o.   MRN: [id number]  HPI: ROARKE MARCIANO Williams is a 60 y.o. male presenting on 12/10/2020 for comprehensive medical examination. Current medical complaints include:none  He currently lives with: wife Interim Problems from his last visit: no   HYPERLIPIDEMIA Continues on Atorvastatin 10 MG daily. Hyperlipidemia status: good compliance Satisfied with current treatment?  yes Side effects:  no Medication compliance: good compliance Past cholesterol meds: Atorvastatin Supplements: none Aspirin:  yes The 10-year ASCVD risk score Mikey Bussing DC Jr., et al., 2013) is: 13.8%   Values used to calculate the score:     Age: 36 years     Sex: Male     Is Non-Hispanic African American: No     Diabetic: No     Tobacco smoker: Yes     Systolic Blood Pressure: 222 mmHg     Is BP treated: No     HDL Cholesterol: 37 mg/dL     Total Cholesterol: 143 mg/dL Chest pain:  no Coronary artery disease:  no Family history CAD:  no Family history early CAD:  no   COPD Noted on CT scan in December 2021 and again 2022.  He only smokes an occasional cigar. At one time smoked pretty heavy.  CT noted aortic atherosclerosis, he continues statin and ASA. Currently not using any inhalers. COPD status: stable Satisfied with current treatment?: yes Oxygen use: no Dyspnea frequency: none Cough frequency: occasional Rescue inhaler frequency: none used Limitation of activity: no Productive cough: none Last Spirometry: today Pneumovax: Not up to Date Influenza: Up to Date  Functional Status Survey: Is the patient deaf or have difficulty hearing?: No Does the patient have difficulty seeing, even when wearing glasses/contacts?: No Does the patient have difficulty  concentrating, remembering, or making decisions?: No Does the patient have difficulty walking or climbing stairs?: No Does the patient have difficulty dressing or bathing?: No Does the patient have difficulty doing errands alone such as visiting a doctor's office or shopping?: No  FALL RISK: Fall Risk  12/10/2020 03/18/2020 09/18/2019 05/24/2017  Falls in the past year? 0 0 0 No  Number falls in past yr: - 0 0 -  Injury with Fall? - 0 0 -    Depression Screen Depression screen [facility] 2/9 12/10/2020 09/18/2019 05/24/2018 05/24/2017 03/03/2016  Decreased Interest 0 0 0 0 0  Down, Depressed, Hopeless 0 0 0 0 1  PHQ - 2 Score 0 0 0 0 1  Altered sleeping 0 0 0 - 0  Tired, decreased energy 0 0 3 - 0  Change in appetite 0 0 0 - 0  Feeling bad or failure about yourself  0 0 0 - 0  Trouble concentrating 0 0 0 - 0  Moving slowly or fidgety/restless 0 0 0 - 0  Suicidal thoughts 0 0 0 - 0  PHQ-9 Score 0 0 3 - 1    Advanced Directives <no information>  Past Medical History:  Past Medical History:  Diagnosis Date  . ED (erectile dysfunction)   . Gilbert's disease   . Hyperlipidemia   . Myopia   . Obesity   . Progressive  iris atrophy of right eye   . Vitreous degeneration of right eye     Surgical History:  Past Surgical History:  Procedure Laterality Date  . CARDIAC CATHETERIZATION    . COLONOSCOPY W/ POLYPECTOMY  2012  . COLONOSCOPY WITH PROPOFOL N/A 10/16/2016   Procedure: COLONOSCOPY WITH PROPOFOL;  Surgeon: Jonathon Bellows, MD;  Location: [facility] ENDOSCOPY;  Service: Endoscopy;  Laterality: N/A;  . EYE SURGERY      Medications:  Current Outpatient Medications on File Prior to Visit  Medication Sig  . ASPIRIN 81 PO Take by mouth daily.  . Multiple Vitamins tablet Take by mouth.  . Omega-3 Fatty Acids (FISH OIL BURP-LESS PO) Take by mouth.   No current facility-administered medications on file prior to visit.    Allergies:  No Known Allergies  Social History:  Social History    Socioeconomic History  . Marital status: Married    Spouse name: Not on file  . Number of children: Not on file  . Years of education: Not on file  . Highest education level: Not on file  Occupational History  . Not on file  Tobacco Use  . Smoking status: Current Some Day Smoker    Types: Cigars  . Smokeless tobacco: Former Systems developer    Types: Snuff  Vaping Use  . Vaping Use: Never used  Substance and Sexual Activity  . Alcohol use: No  . Drug use: No  . Sexual activity: Not on file  Other Topics Concern  . Not on file  Social History Narrative  . Not on file   Social Determinants of Health   Financial Resource Strain: Not on file  Food Insecurity: Not on file  Transportation Needs: Not on file  Physical Activity: Not on file  Stress: Not on file  Social Connections: Not on file  Intimate Partner Violence: Not on file   Social History   Tobacco Use  Smoking Status Current Some Day Smoker  . Types: Cigars  Smokeless Tobacco Former Systems developer  . Types: Snuff   Social History   Substance and Sexual Activity  Alcohol Use No    Family History:  Family History  Problem Relation Age of Onset  . Cancer Mother   . Cancer Father   . Heart disease Father     Past medical history, surgical history, medications, allergies, family history and social history reviewed with patient today and changes made to appropriate areas of the chart.   Review of Systems - negative All other ROS negative except what is listed above and in the HPI.      Objective:    BP 136/88 (BP Location: Left Arm)   Pulse (!) 58   Temp 97.8 F (36.6 C) (Oral)   Ht 5' 9.21" (1.758 m)   Wt 274 lb 12.8 oz (124.6 kg)   SpO2 95%   BMI 40.33 kg/m   Wt Readings from Last 3 Encounters:  12/10/20 274 lb 12.8 oz (124.6 kg)  11/14/20 269 lb (122 kg)  03/18/20 269 lb 2 oz (122.1 kg)    Physical Exam Vitals and nursing note reviewed.  Constitutional:      General: He is awake. He is not in acute  distress.    Appearance: He is well-developed and well-groomed. He is obese. He is not ill-appearing.  HENT:     Head: Normocephalic and atraumatic.     Right Ear: Hearing, tympanic membrane, ear canal and external ear normal. No drainage.     Left Ear:  Hearing, tympanic membrane, ear canal and external ear normal. No drainage.     Nose: Nose normal.     Mouth/Throat:     Pharynx: Uvula midline.  Eyes:     General: Lids are normal.        Right eye: No discharge.        Left eye: No discharge.     Extraocular Movements: Extraocular movements intact.     Conjunctiva/sclera: Conjunctivae normal.     Pupils: Pupils are equal, round, and reactive to light.     Visual Fields: Right eye visual fields normal and left eye visual fields normal.  Neck:     Thyroid: No thyromegaly.     Vascular: No carotid bruit or JVD.     Trachea: Trachea normal.  Cardiovascular:     Rate and Rhythm: Normal rate and regular rhythm.     Heart sounds: Normal heart sounds, S1 normal and S2 normal. No murmur heard. No gallop.   Pulmonary:     Effort: Pulmonary effort is normal. No accessory muscle usage or respiratory distress.     Breath sounds: Normal breath sounds.  Abdominal:     General: Bowel sounds are normal.     Palpations: Abdomen is soft. There is no hepatomegaly or splenomegaly.     Tenderness: There is no abdominal tenderness.  Genitourinary:    Comments: Deferred per patient request. Musculoskeletal:        General: Normal range of motion.     Cervical back: Normal range of motion and neck supple.     Right lower leg: No edema.     Left lower leg: No edema.  Lymphadenopathy:     Head:     Right side of head: No submental, submandibular, tonsillar, preauricular or posterior auricular adenopathy.     Left side of head: No submental, submandibular, tonsillar, preauricular or posterior auricular adenopathy.     Cervical: No cervical adenopathy.  Skin:    General: Skin is warm and dry.      Capillary Refill: Capillary refill takes less than 2 seconds.     Findings: No rash.  Neurological:     Mental Status: He is alert and oriented to person, place, and time.     Cranial Nerves: Cranial nerves are intact.     Gait: Gait is intact.     Deep Tendon Reflexes: Reflexes are normal and symmetric.     Reflex Scores:      Brachioradialis reflexes are 2+ on the right side and 2+ on the left side.      Patellar reflexes are 2+ on the right side and 2+ on the left side. Psychiatric:        Attention and Perception: Attention normal.        Mood and Affect: Mood normal.        Speech: Speech normal.        Behavior: Behavior normal. Behavior is cooperative.        Thought Content: Thought content normal.        Cognition and Memory: Cognition normal.        Judgment: Judgment normal.    Results for orders placed or performed in visit on 29/56/21  Basic metabolic panel  Result Value Ref Range   Glucose 90 65 - 99 mg/dL   BUN 13 6 - 24 mg/dL   Creatinine, Ser 1.00 0.76 - 1.27 mg/dL   GFR calc non Af Amer 82 >59 mL/min/1.73   GFR calc  Af Amer 95 >59 mL/min/1.73   BUN/Creatinine Ratio 13 9 - 20   Sodium 141 134 - 144 mmol/L   Potassium 4.8 3.5 - 5.2 mmol/L   Chloride 104 96 - 106 mmol/L   CO2 22 20 - 29 mmol/L   Calcium 9.0 8.7 - 10.2 mg/dL  Lipid Panel w/o Chol/HDL Ratio  Result Value Ref Range   Cholesterol, Total 143 100 - 199 mg/dL   Triglycerides 278 (H) 0 - 149 mg/dL   HDL 37 (L) >39 mg/dL   VLDL Cholesterol Cal 44 (H) 5 - 40 mg/dL   LDL Chol Calc (NIH) 62 0 - 99 mg/dL      Assessment & Plan:   Problem List Items Addressed This Visit      Cardiovascular and Mediastinum   Aortic atherosclerosis ([place])    Noted on lung CT screening, discussed with patient and educated.  Continue statin and ASA daily.  Recommend complete cessation of smoking for prevention.      Relevant Medications   atorvastatin (LIPITOR) 10 MG tablet     Respiratory   Emphysema of lung ([place])  - Primary    Ongoing without symptoms, noted on CT lung screening (mild).  No current inhalers.  Spirometry today with FEV1 84% and FEV1/FVC 100% pre.  Recommend complete cessation of smoking for prevention and educated on emphysema.  Continue annual screening.  Return in 6 months.  Initiate inhalers as needed.      Relevant Orders   CBC w/Diff   Spirometry with graph (Completed)     Other   Hyperlipidemia    Chronic, ongoing.  Recheck lipid panel today.  Continue current medication regimen and adjust as needed.        Relevant Medications   atorvastatin (LIPITOR) 10 MG tablet   Other Relevant Orders   Lipid Profile   Comp Met (CMET)   Gilbert's disease    Ongoing, stable.  Continue to monitor.  CMP today.      Obesity    BMI 40.33. Recommended eating smaller high protein, low fat meals more frequently and exercising 30 mins a day 5 times a week with a goal of 10-15lb weight loss in the next 3 months. Patient voiced their understanding and motivation to adhere to these recommendations.        Lung nodule < 6cm on CT    Noted on CT lung screening, continue annual screening to monitor area.      Nicotine dependence, cigarettes, uncomplicated    Smokes minimally at this time -- more cigars.  I have recommended complete cessation of tobacco use. I have discussed various options available for assistance with tobacco cessation including over the counter methods (Nicotine gum, patch and lozenges). We also discussed prescription options (Chantix, Nicotine Inhaler / Nasal Spray). The patient is not interested in pursuing any prescription tobacco cessation options at this time.       Elevated BP without diagnosis of hypertension    Elevated today on initial check and on repeat remains above goal.  Educated him at length on DASH diet and recommend heavy focus on this.  Recommend she monitor BP at least a few mornings a week at home and document.   Labs today.  Will plan to initiate  medication as needed next visit if ongoing elevations.  Return in 6 months.        Other Visit Diagnoses    Thyroid disorder screen       TSH on labs today  Relevant Orders   TSH   Prostate cancer screening       PSA on labs today   Relevant Orders   PSA   Encounter for annual physical exam       Annual labs today to include CBC, CMP, TSH, lipid, PSA.  Health maintenance reviewed.   Relevant Orders   Microalbumin, Urine Waived   Need for influenza vaccination       Flu vaccine today   Relevant Orders   Flu Vaccine QUAD 36+ mos IM (Completed)      Discussed aspirin prophylaxis for myocardial infarction prevention and decision was made to continue ASA  LABORATORY TESTING:  Health maintenance labs ordered today as discussed above.   The natural history of prostate cancer and ongoing controversy regarding screening and potential treatment outcomes of prostate cancer has been discussed with the patient. The meaning of a false positive PSA and a false negative PSA has been discussed. He indicates understanding of the limitations of this screening test and wishes to proceed with screening PSA testing.  IMMUNIZATIONS:   - Tdap: Tetanus vaccination status reviewed: last tetanus booster within 10 years. - Influenza: Up to date - Pneumovax: wishes to think about this - Prevnar: Not applicable - Zostavax vaccine: Up to date  SCREENING: - Colonoscopy: Up to date  Discussed with patient purpose of the colonoscopy is to detect colon cancer at curable precancerous or early stages   - AAA Screening: Not applicable  -Hearing Test: Not applicable  -Spirometry: Not applicable   PATIENT COUNSELING:    Sexuality: Discussed sexually transmitted diseases, partner selection, use of condoms, avoidance of unintended pregnancy  and contraceptive alternatives.   Advised to avoid cigarette smoking.  I discussed with the patient that most people either abstain from alcohol or drink within safe  limits (<=14/week and <=4 drinks/occasion for males, <=7/weeks and <= 3 drinks/occasion for females) and that the risk for alcohol disorders and other health effects rises proportionally with the number of drinks per week and how often a drinker exceeds daily limits.  Discussed cessation/primary prevention of drug use and availability of treatment for abuse.   Diet: Encouraged to adjust caloric intake to maintain  or achieve ideal body weight, to reduce intake of dietary saturated fat and total fat, to limit sodium intake by avoiding high sodium foods and not adding table salt, and to maintain adequate dietary potassium and calcium preferably from fresh fruits, vegetables, and low-fat dairy products.    Stressed the importance of regular exercise  Injury prevention: Discussed safety belts, safety helmets, smoke detector, smoking near bedding or upholstery.   Dental health: Discussed importance of regular tooth brushing, flossing, and dental visits.   Follow up plan: NEXT PREVENTATIVE PHYSICAL DUE IN 1 YEAR. Return in about 6 months (around 06/09/2021) for HLD, GILBERT'S, COPD, ELEVATED BP.

## 2020-12-10 NOTE — Assessment & Plan Note (Signed)
Noted on lung CT screening, discussed with patient and educated.  Continue statin and ASA daily.  Recommend complete cessation of smoking for prevention. 

## 2020-12-10 NOTE — Assessment & Plan Note (Signed)
Smokes minimally at this time -- more cigars.  I have recommended complete cessation of tobacco use. I have discussed various options available for assistance with tobacco cessation including over the counter methods (Nicotine gum, patch and lozenges). We also discussed prescription options (Chantix, Nicotine Inhaler / Nasal Spray). The patient is not interested in pursuing any prescription tobacco cessation options at this time.

## 2020-12-10 NOTE — Patient Instructions (Signed)
Check blood pressure 3 mornings a week and document -- goal is less then 130/80 -- bring readings to next visit with [place].   DASH Eating Plan DASH stands for Dietary Approaches to Stop Hypertension. The DASH eating plan is a healthy eating plan that has been shown to:  Reduce high blood pressure (hypertension).  Reduce your risk for type 2 diabetes, heart disease, and stroke.  Help with weight loss. What are tips for following this plan? Reading food labels  Check food labels for the amount of salt (sodium) per serving. Choose foods with less than 5 percent of the Daily Value of sodium. Generally, foods with less than 300 milligrams (mg) of sodium per serving fit into this eating plan.  To find whole grains, look for the word "whole" as the first word in the ingredient list. Shopping  Buy products labeled as "low-sodium" or "no salt added."  Buy fresh foods. Avoid canned foods and pre-made or frozen meals. Cooking  Avoid adding salt when cooking. Use salt-free seasonings or herbs instead of table salt or sea salt. Check with your health care provider or pharmacist before using salt substitutes.  Do not fry foods. Cook foods using healthy methods such as baking, boiling, grilling, roasting, and broiling instead.  Cook with heart-healthy oils, such as olive, canola, avocado, soybean, or sunflower oil. Meal planning  Eat a balanced diet that includes: ? 4 or more servings of fruits and 4 or more servings of vegetables each day. Try to fill one-half of your plate with fruits and vegetables. ? 6-8 servings of whole grains each day. ? Less than 6 oz (170 g) of lean meat, poultry, or fish each day. A 3-oz (85-g) serving of meat is about the same size as a deck of cards. One egg equals 1 oz (28 g). ? 2-3 servings of low-fat dairy each day. One serving is 1 cup (237 mL). ? 1 serving of nuts, seeds, or beans 5 times each week. ? 2-3 servings of heart-healthy fats. Healthy fats called omega-3  fatty acids are found in foods such as walnuts, flaxseeds, fortified milks, and eggs. These fats are also found in cold-water fish, such as sardines, salmon, and mackerel.  Limit how much you eat of: ? Canned or prepackaged foods. ? Food that is high in trans fat, such as some fried foods. ? Food that is high in saturated fat, such as fatty meat. ? Desserts and other sweets, sugary drinks, and other foods with added sugar. ? Full-fat dairy products.  Do not salt foods before eating.  Do not eat more than 4 egg yolks a week.  Try to eat at least 2 vegetarian meals a week.  Eat more home-cooked food and less restaurant, buffet, and fast food.   Lifestyle  When eating at a restaurant, ask that your food be prepared with less salt or no salt, if possible.  If you drink alcohol: ? Limit how much you use to:  0-1 drink a day for women who are not pregnant.  0-2 drinks a day for men. ? Be aware of how much alcohol is in your drink. In the [place] one drink equals one 12 oz bottle of beer (355 mL), one 5 oz glass of wine (148 mL), or one 1 oz glass of hard liquor (44 mL). General information  Avoid eating more than 2,300 mg of salt a day. If you have hypertension, you may need to reduce your sodium intake to 1,500 mg a day.  Work with your health care provider to maintain a healthy body weight or to lose weight. Ask what an ideal weight is for you.  Get at least 30 minutes of exercise that causes your heart to beat faster (aerobic exercise) most days of the week. Activities may include walking, swimming, or biking.  Work with your health care provider or dietitian to adjust your eating plan to your individual calorie needs. What foods should I eat? Fruits All fresh, dried, or frozen fruit. Canned fruit in natural juice (without added sugar). Vegetables Fresh or frozen vegetables (raw, steamed, roasted, or grilled). Low-sodium or reduced-sodium tomato and vegetable juice. Low-sodium or  reduced-sodium tomato sauce and tomato paste. Low-sodium or reduced-sodium canned vegetables. Grains Whole-grain or whole-wheat bread. Whole-grain or whole-wheat pasta. Brown rice. [place]. Bulgur. Whole-grain and low-sodium cereals. Pita bread. Low-fat, low-sodium crackers. Whole-wheat flour tortillas. Meats and other proteins Skinless chicken or [place]. Ground chicken or [place]. Pork with fat trimmed off. Fish and seafood. Egg whites. Dried beans, peas, or lentils. Unsalted nuts, nut butters, and seeds. Unsalted canned beans. Lean cuts of beef with fat trimmed off. Low-sodium, lean precooked or cured meat, such as sausages or meat loaves. Dairy Low-fat (1%) or fat-free (skim) milk. Reduced-fat, low-fat, or fat-free cheeses. Nonfat, low-sodium ricotta or cottage cheese. Low-fat or nonfat yogurt. Low-fat, low-sodium cheese. Fats and oils Soft margarine without trans fats. Vegetable oil. Reduced-fat, low-fat, or light mayonnaise and salad dressings (reduced-sodium). Canola, safflower, olive, avocado, soybean, and sunflower oils. Avocado. Seasonings and condiments Herbs. Spices. Seasoning mixes without salt. Other foods Unsalted popcorn and pretzels. Fat-free sweets. The items listed above may not be a complete list of foods and beverages you can eat. Contact a dietitian for more information. What foods should I avoid? Fruits Canned fruit in a light or heavy syrup. Fried fruit. Fruit in cream or butter sauce. Vegetables Creamed or fried vegetables. Vegetables in a cheese sauce. Regular canned vegetables (not low-sodium or reduced-sodium). Regular canned tomato sauce and paste (not low-sodium or reduced-sodium). Regular tomato and vegetable juice (not low-sodium or reduced-sodium). Angie Fava. Olives. Grains Baked goods made with fat, such as croissants, muffins, or some breads. Dry pasta or rice meal packs. Meats and other proteins Fatty cuts of meat. Ribs. Fried meat. Berniece Salines. Bologna,  salami, and other precooked or cured meats, such as sausages or meat loaves. Fat from the back of a pig (fatback). Bratwurst. Salted nuts and seeds. Canned beans with added salt. Canned or smoked fish. Whole eggs or egg yolks. Chicken or [place] with skin. Dairy Whole or 2% milk, cream, and half-and-half. Whole or full-fat cream cheese. Whole-fat or sweetened yogurt. Full-fat cheese. Nondairy creamers. Whipped toppings. Processed cheese and cheese spreads. Fats and oils Butter. Stick margarine. Lard. Shortening. Ghee. Bacon fat. Tropical oils, such as coconut, palm kernel, or palm oil. Seasonings and condiments Onion salt, garlic salt, seasoned salt, table salt, and sea salt. Worcestershire sauce. Tartar sauce. Barbecue sauce. Teriyaki sauce. Soy sauce, including reduced-sodium. Steak sauce. Canned and packaged gravies. Fish sauce. Oyster sauce. Cocktail sauce. Store-bought horseradish. Ketchup. Mustard. Meat flavorings and tenderizers. Bouillon cubes. Hot sauces. Pre-made or packaged marinades. Pre-made or packaged taco seasonings. Relishes. Regular salad dressings. Other foods Salted popcorn and pretzels. The items listed above may not be a complete list of foods and beverages you should avoid. Contact a dietitian for more information. Where to find more information  National Heart, Lung, and Blood Institute: https://wilson-eaton.com/  American Heart Association: www.heart.org  Academy of Nutrition and  Dietetics: www.eatright.[place]: www.kidney.org Summary  The DASH eating plan is a healthy eating plan that has been shown to reduce high blood pressure (hypertension). It may also reduce your risk for type 2 diabetes, heart disease, and stroke.  When on the DASH eating plan, aim to eat more fresh fruits and vegetables, whole grains, lean proteins, low-fat dairy, and heart-healthy fats.  With the DASH eating plan, you should limit salt (sodium) intake to 2,300 mg a day. If you  have hypertension, you may need to reduce your sodium intake to 1,500 mg a day.  Work with your health care provider or dietitian to adjust your eating plan to your individual calorie needs. This information is not intended to replace advice given to you by your health care provider. Make sure you discuss any questions you have with your health care provider. Document Revised: 09/22/2019 Document Reviewed: 09/22/2019 Elsevier Patient Education  2021 [employer].

## 2020-12-10 NOTE — Assessment & Plan Note (Signed)
Noted on CT lung screening, continue annual screening to monitor area.

## 2020-12-11 ENCOUNTER — Telehealth: Payer: Self-pay | Admitting: Family Medicine

## 2020-12-11 LAB — CBC WITH DIFFERENTIAL/PLATELET
Basophils Absolute: 0.1 10*3/uL (ref 0.0–0.2)
Basos: 1 %
EOS (ABSOLUTE): 0.1 10*3/uL (ref 0.0–0.4)
Eos: 2 %
Hematocrit: 47.8 % (ref 37.5–51.0)
Hemoglobin: 16.2 g/dL (ref 13.0–17.7)
Immature Grans (Abs): 0 10*3/uL (ref 0.0–0.1)
Immature Granulocytes: 0 %
Lymphocytes Absolute: 2.7 10*3/uL (ref 0.7–3.1)
Lymphs: 40 %
MCH: 31.2 pg (ref 26.6–33.0)
MCHC: 33.9 g/dL (ref 31.5–35.7)
MCV: 92 fL (ref 79–97)
Monocytes Absolute: 0.6 10*3/uL (ref 0.1–0.9)
Monocytes: 9 %
Neutrophils Absolute: 3.2 10*3/uL (ref 1.4–7.0)
Neutrophils: 48 %
Platelets: 192 10*3/uL (ref 150–450)
RBC: 5.2 x10E6/uL (ref 4.14–5.80)
RDW: 12.9 % (ref 11.6–15.4)
WBC: 6.7 10*3/uL (ref 3.4–10.8)

## 2020-12-11 LAB — LIPID PANEL
Chol/HDL Ratio: 4.1 ratio (ref 0.0–5.0)
Cholesterol, Total: 142 mg/dL (ref 100–199)
HDL: 35 mg/dL — ABNORMAL LOW (ref >39)
LDL Chol Calc (NIH): 57 mg/dL (ref 0–99)
Triglycerides: 325 mg/dL — ABNORMAL HIGH (ref 0–149)
VLDL Cholesterol Cal: 50 mg/dL — ABNORMAL HIGH (ref 5–40)

## 2020-12-11 LAB — PSA: Prostate Specific Ag, Serum: 0.8 ng/mL (ref 0.0–4.0)

## 2020-12-11 LAB — COMPREHENSIVE METABOLIC PANEL
ALT: 51 IU/L — ABNORMAL HIGH (ref 0–44)
AST: 33 IU/L (ref 0–40)
Albumin/Globulin Ratio: 2.1 (ref 1.2–2.2)
Albumin: 4.9 g/dL (ref 3.8–4.9)
Alkaline Phosphatase: 59 IU/L (ref 44–121)
BUN/Creatinine Ratio: 13 (ref 10–24)
BUN: 12 mg/dL (ref 8–27)
Bilirubin Total: 1.4 mg/dL — ABNORMAL HIGH (ref 0.0–1.2)
CO2: 21 mmol/L (ref 20–29)
Calcium: 9.3 mg/dL (ref 8.6–10.2)
Chloride: 104 mmol/L (ref 96–106)
Creatinine, Ser: 0.92 mg/dL (ref 0.76–1.27)
GFR calc Af Amer: 104 mL/min/{1.73_m2} (ref >59)
GFR calc non Af Amer: 90 mL/min/{1.73_m2} (ref >59)
Globulin, Total: 2.3 g/dL (ref 1.5–4.5)
Glucose: 83 mg/dL (ref 65–99)
Potassium: 4.4 mmol/L (ref 3.5–5.2)
Sodium: 140 mmol/L (ref 134–144)
Total Protein: 7.2 g/dL (ref 6.0–8.5)

## 2020-12-11 NOTE — Telephone Encounter (Signed)
Pt states he has had the covid vaccine 2 shots.  He is really not interested in getting the booster.

## 2020-12-11 NOTE — Progress Notes (Signed)
Contacted via MyChart   Good evening Willie Williams, your labs have returned: - Prostate lab is normal - CBC shows no anemia - Liver function testing is trending down this check, was mild elevation last check.  Continue to ensure minimal to no alcohol intake or acetaminophen containing medication (like Tylenol).  Kidney function is normal. - Cholesterol levels show good LDL, bad cholesterol, but ongoing elevation in triglycerides.  Continue your Atorvastatin and Fish Oil daily, if increase in future we may need to add extra medication.  Any questions for me? Keep being awesome!!  Thank you for allowing me to participate in your care. Kindest regards, Ryin Schillo

## 2020-12-11 NOTE — Addendum Note (Signed)
Addended by: Dorris Fetch on: 12/11/2020 04:52 PM   Modules accepted: Orders

## 2020-12-11 NOTE — Addendum Note (Signed)
Addended by: Marnee Guarneri T on: 12/11/2020 04:52 PM   Modules accepted: Orders

## 2020-12-13 ENCOUNTER — Telehealth: Payer: Self-pay

## 2020-12-13 LAB — HEMOGLOBIN A1C
Est. average glucose Bld gHb Est-mCnc: 120 mg/dL
Hgb A1c MFr Bld: 5.8 % — ABNORMAL HIGH (ref 4.8–5.6)

## 2020-12-13 LAB — SPECIMEN STATUS REPORT

## 2020-12-13 NOTE — Progress Notes (Signed)
Contacted via MyChart   Good morning Willie Williams, your A1c has returned.  The A1C is the diabetes testing we talked about, this looks at your blood sugars over the past 3 months and turns the average into a number.  Your number is 5.8%, meaning you are prediabetic.  Any number 5.7 to 6.4 is considered prediabetes and any number 6.5 or greater is considered diabetes.   I would recommend heavy focus on decreasing foods high in sugar and your intake of things like bread products, pasta, and rice.  The American Diabetes Association online has a large amount of information on diet changes to make.  We will recheck this number in 3 months to ensure you are not continuing to trend upwards and move into diabetes.  Have a good day.

## 2020-12-13 NOTE — Telephone Encounter (Signed)
Biometric screening form filled out and signed by Jolene. Faxed back as requested by the patient. Called and notified him that this was done for him.

## 2021-01-28 ENCOUNTER — Telehealth: Payer: Self-pay

## 2021-01-28 NOTE — Telephone Encounter (Signed)
Copied from [place] [id number]. Topic: General - Inquiry >> Jan 28, 2021 10:07 AM Willie Williams D wrote: Reason for CRM: Pt had an office visit on 2/9 and he is getting a bill for the OV and radiology.  He said this  Is suppose to be his yearly PE that the insurance pay for.  He called insurance and they told him it was probab;y coded incorrectly  Can someone look into this for him CB#  [phone]   Pt stated he felt like the did not speak to the Provider on anything that the physical is being coded for stated provider was the one that asked the questions Pt stated he would like to speak to practice admin as he wants to know if there is any other code that can be added in order for him not to be responsible for the charge. Pt was made aware of the physical procedure and stated he understood that but was upset as to why he had the bill when he felt like he did not ask questions. Routing to [employer] as he stated insurance is stating the apt was not coded correctly . Willie Williams

## 2021-02-18 NOTE — Telephone Encounter (Signed)
Sent response to Raquel in billing that patient's charges are correct.

## 2021-06-09 ENCOUNTER — Ambulatory Visit: Payer: BC Managed Care – PPO | Admitting: Internal Medicine

## 2021-09-01 ENCOUNTER — Ambulatory Visit
Admission: EM | Admit: 2021-09-01 | Discharge: 2021-09-01 | Disposition: A | Discharge status: Home / Self Care | Payer: No Typology Code available for payment source | Attending: Emergency Medicine | Admitting: Emergency Medicine

## 2021-09-01 ENCOUNTER — Ambulatory Visit (INDEPENDENT_AMBULATORY_CARE_PROVIDER_SITE_OTHER): Payer: Self-pay

## 2021-09-01 DIAGNOSIS — S46209A Unspecified injury of muscle, fascia and tendon of other parts of biceps, unspecified arm, initial encounter: Secondary | ICD-10-CM

## 2021-09-01 DIAGNOSIS — M25512 Pain in left shoulder: Secondary | ICD-10-CM | POA: Diagnosis not present

## 2021-09-01 NOTE — ED Triage Notes (Signed)
Pt here with C/O Left shoulder injury, heard a pop and crack , noticed a bruise on left bicep yesterday

## 2021-09-01 NOTE — Discharge Instructions (Addendum)
Your x-rays today did not demonstrate any bony injury in your left shoulder.  As we discussed, I am suspicious that you may have injured your biceps muscle in your left upper arm.  Wear the sling during the day and you may take it off to bathe and at bedtime to protect your left arm from further injury.  Take over-the-counter Tylenol and ibuprofen according to the package instructions as needed for pain.  You can apply ice to your left shoulder and left upper arm for 20 minutes at a time, 2-3 times a day to help with pain and inflammation.  Do not apply the ice directly to your skin.  Call Dr. Harlow Mares office in the morning and schedule an appointment for follow-up and evaluation as you will need orthopedic consultation.

## 2021-09-01 NOTE — ED Provider Notes (Signed)
[facility]    CSN: [id number] Arrival date & time: 09/01/21  1318      History   Chief Complaint Chief Complaint  Patient presents with   Shoulder Injury    left    HPI LYNDEN FLEMMER II is a 60 y.o. male.   HPI  60 year old male here for evaluation of left shoulder pain.  Patient reports that he was trying to place a box on a shelf over his head 2 days ago and the bike started to fall and when he went to catch he felt a pop in his left shoulder.  Yesterday he felt a crack in his shoulder and then developed bruising in his left bicep.  He states that he is able to type on the computer and does not have any numbness or tingling in his fingers.  He does report that he has decreased ability to lift objects secondary to the pain in his arm.  Past Medical History:  Diagnosis Date   ED (erectile dysfunction)    Gilbert's disease    Hyperlipidemia    Myopia    Obesity    Progressive iris atrophy of right eye    Vitreous degeneration of right eye     Patient Active Problem List   Diagnosis Date Noted   Elevated BP without diagnosis of hypertension 12/10/2020   Nicotine dependence, cigarettes, uncomplicated 56/81/2751   Emphysema of lung ([place]) 10/04/2019   Aortic atherosclerosis ([place]) 10/04/2019   Lung nodule < 6cm on CT 10/04/2019   Obesity 09/18/2019   Benign neoplasm of transverse colon    Gilbert's disease 03/03/2016   Hyperlipidemia     Past Surgical History:  Procedure Laterality Date   CARDIAC CATHETERIZATION     COLONOSCOPY W/ POLYPECTOMY  2012   COLONOSCOPY WITH PROPOFOL N/A 10/16/2016   Procedure: COLONOSCOPY WITH PROPOFOL;  Surgeon: Jonathon Bellows, MD;  Location: [facility] ENDOSCOPY;  Service: Endoscopy;  Laterality: N/A;   EYE SURGERY         Home Medications    Prior to Admission medications   Medication Sig Start Date End Date Taking? Authorizing Provider  ASPIRIN 81 PO Take by mouth daily.   Yes [provider]  atorvastatin  (LIPITOR) 10 MG tablet Take 1 tablet (10 mg total) by mouth at bedtime. 12/10/20  Yes Cannady, Jolene T, NP  meloxicam (MOBIC) 15 MG tablet Take 1 tablet (15 mg total) by mouth daily. 12/10/20  Yes Marnee Guarneri T, NP  Multiple Vitamins tablet Take by mouth.   Yes [provider]  Omega-3 Fatty Acids (FISH OIL BURP-LESS PO) Take by mouth.   Yes [provider]    Family History Family History  Problem Relation Age of Onset   Cancer Mother    Cancer Father    Heart disease Father     Social History Social History   Tobacco Use   Smoking status: Some Days    Types: Cigars   Smokeless tobacco: Former    Types: Snuff  Vaping Use   Vaping Use: Never used  Substance Use Topics   Alcohol use: No   Drug use: No     Allergies   Patient has no known allergies.   Review of Systems Review of Systems  Constitutional:  Negative for activity change, appetite change and fever.  Musculoskeletal:  Positive for myalgias. Negative for arthralgias and joint swelling.  Skin:  Positive for color change.  Neurological:  Negative for weakness and numbness.  Physical Exam Triage Vital Signs ED Triage Vitals  Enc Vitals Group     BP 09/01/21 1514 140/78     Pulse Rate 09/01/21 1514 95     Resp 09/01/21 1514 18     Temp 09/01/21 1514 98.1 F (36.7 C)     Temp Source 09/01/21 1514 Oral     SpO2 09/01/21 1514 98 %     Weight 09/01/21 1512 268 lb (121.6 kg)     Height 09/01/21 1512 5\' 11"  (1.803 m)     Head Circumference --      Peak Flow --      Pain Score 09/01/21 1512 4     Pain Loc --      Pain Edu? --      Excl. in [place]? --    No data found.  Updated Vital Signs BP 140/78 (BP Location: Left Arm)   Pulse 95   Temp 98.1 F (36.7 C) (Oral)   Resp 18   Ht 5\' 11"  (1.803 m)   Wt 268 lb (121.6 kg)   SpO2 98%   BMI 37.38 kg/m   Visual Acuity Right Eye Distance:   Left Eye Distance:   Bilateral Distance:    Right Eye Near:   Left Eye Near:    Bilateral  Near:     Physical Exam Vitals and nursing note reviewed.  Constitutional:      Appearance: Normal appearance. He is not ill-appearing.  HENT:     Head: Normocephalic and atraumatic.  Musculoskeletal:        General: Swelling, tenderness, deformity and signs of injury present. Normal range of motion.  Skin:    General: Skin is warm and dry.     Capillary Refill: Capillary refill takes less than 2 seconds.     Findings: Bruising present. No erythema.  Neurological:     General: No focal deficit present.     Mental Status: He is alert and oriented to person, place, and time.     Motor: No weakness.  Psychiatric:        Mood and Affect: Mood normal.        Behavior: Behavior normal.        Thought Content: Thought content normal.        Judgment: Judgment normal.     UC Treatments / Results  Labs (all labs ordered are listed, but only abnormal results are displayed) Labs Reviewed - No data to display  EKG   Radiology DG Shoulder Left  Result Date: 09/01/2021 CLINICAL DATA:  Pain for 2 days.  Patient reports hearing a pop. EXAM: LEFT SHOULDER - 2+ VIEW COMPARISON:  None. FINDINGS: There is no evidence of fracture or dislocation. Mild acromioclavicular degenerative change. Normal glenohumeral joint. No erosion or avascular necrosis. There may be mild soft tissue edema laterally. IMPRESSION: No acute osseous abnormality. Mild acromioclavicular degenerative change. Electronically Signed   By: Keith Rake M.D.   On: 09/01/2021 16:00    Procedures Procedures (including critical care time)  Medications Ordered in UC Medications - No data to display  Initial Impression / Assessment and Plan / UC Course  I have reviewed the triage vital signs and the nursing notes.  Pertinent labs & imaging results that were available during my care of the patient were reviewed by me and considered in my medical decision making (see chart for details).  Patient is a very pleasant,  nontoxic-appearing 60 year old male here for evaluation of left arm and shoulder pain  that started 2 days ago when he was try to place a heavy box on a shelf over his head and the box started to fall.  He reports that he initially felt a pop and then the following day he felt a crack and developed bruising to his left bicep.  He does not have any numbness or tingling in his fingers and has full range of motion of his elbow, wrist, and hand but he states that he does have a decreased ability to lift objects secondary to pain.  On physical exam patient's left shoulder is in normal anatomical alignment but there is a marked area of ecchymosis on the anterior and medial aspect of the bicep.  Patient is able to externally rotate, internally rotate, and abduct his left shoulder without difficulty.  External rotation does cause increase in pain up in his trapezius area.  When he abducts his left arm and internally rotates his left arm there is a noticeable bulge and defect to the body of the left bicep.  The area underlying the ecchymosis is mildly boggy and tender to palpation.  Patient has no tenderness with palpation of the acromion process or the deltoid muscle of the left shoulder.  No tenderness with palpation of the anterior posterior joint capsule.  Patient does have some tenderness with palpation of the trapezius but there is no muscle defect, spasm, or tension appreciated on exam.  No ecchymosis or erythema.  Radial and ulnar pulses are 2+.  Patient's grip strength in his left hand is 5/5 when compared to the right.  Patient is 5/5 strength in the left upper extremity to resisted flexion and extension without exacerbation of pain.  There is a concern that patient has a muscle injury.  We will obtain radiograph of left shoulder to look for bony deformity or calcified tendon which may be explaining some of the injury.  Left shoulder films independently reviewed and evaluated by me.  Impression: No evidence of  fracture or dislocation.  Radiology overread is pending. Radiology impression is no acute osseous abnormality.  Mild acromioclavicular degenerative change.  Suspect patient has muscle tear based upon his description of the mechanism, pain, and ecchymosis in the mid bicep region.  We will place patient in a sling and have him follow-up with orthopedics.  Contact information for Dr. Harlow Mares given the patient discharge as he is on for unassigned call.    Final Clinical Impressions(s) / UC Diagnoses   Final diagnoses:  Acute pain of left shoulder  Injury of biceps brachii muscle     Discharge Instructions      Your x-rays today did not demonstrate any bony injury in your left shoulder.  As we discussed, I am suspicious that you may have injured your biceps muscle in your left upper arm.  Wear the sling during the day and you may take it off to bathe and at bedtime to protect your left arm from further injury.  Take over-the-counter Tylenol and ibuprofen according to the package instructions as needed for pain.  You can apply ice to your left shoulder and left upper arm for 20 minutes at a time, 2-3 times a day to help with pain and inflammation.  Do not apply the ice directly to your skin.  Call Dr. Harlow Mares office in the morning and schedule an appointment for follow-up and evaluation as you will need orthopedic consultation.     ED Prescriptions   None    PDMP not reviewed this encounter.  Margarette Canada, NP 09/01/21 1616

## 2021-12-11 ENCOUNTER — Ambulatory Visit (INDEPENDENT_AMBULATORY_CARE_PROVIDER_SITE_OTHER): Payer: No Typology Code available for payment source | Admitting: Internal Medicine

## 2021-12-11 ENCOUNTER — Other Ambulatory Visit: Payer: Self-pay

## 2021-12-11 ENCOUNTER — Encounter: Payer: Self-pay | Admitting: Internal Medicine

## 2021-12-11 VITALS — BP 146/96 | HR 55 | Temp 98.0°F | Ht 70.98 in | Wt 270.2 lb

## 2021-12-11 DIAGNOSIS — E782 Mixed hyperlipidemia: Secondary | ICD-10-CM | POA: Diagnosis not present

## 2021-12-11 DIAGNOSIS — Z0000 Encounter for general adult medical examination without abnormal findings: Secondary | ICD-10-CM

## 2021-12-11 LAB — URINALYSIS, ROUTINE W REFLEX MICROSCOPIC
Bilirubin, UA: NEGATIVE
Glucose, UA: NEGATIVE
Ketones, UA: NEGATIVE
Leukocytes,UA: NEGATIVE
Nitrite, UA: NEGATIVE
Protein,UA: NEGATIVE
Specific Gravity, UA: 1.015 (ref 1.005–1.030)
Urobilinogen, Ur: 0.2 mg/dL (ref 0.2–1.0)
pH, UA: 5.5 (ref 5.0–7.5)

## 2021-12-11 LAB — MICROALBUMIN, URINE WAIVED
Creatinine, Urine Waived: 50 mg/dL (ref 10–300)
Microalb, Ur Waived: 10 mg/L (ref 0–19)

## 2021-12-11 LAB — MICROSCOPIC EXAMINATION
Bacteria, UA: NONE SEEN
Epithelial Cells (non renal): NONE SEEN /hpf (ref 0–10)
WBC, UA: NONE SEEN /hpf (ref 0–5)

## 2021-12-11 LAB — BAYER DCA HB A1C WAIVED: HB A1C (BAYER DCA - WAIVED): 5.5 % (ref 4.8–5.6)

## 2021-12-11 MED ORDER — ATORVASTATIN CALCIUM 10 MG PO TABS: 10.0000 mg | ORAL_TABLET | Freq: Every day | ORAL | 2 refills | Status: DC

## 2021-12-11 NOTE — Progress Notes (Signed)
BP (!) 146/96    Pulse (!) 55    Temp 98 F (36.7 C) (Oral)    Ht 5' 10.98" (1.803 m)    Wt 270 lb 3.2 oz (122.6 kg)    SpO2 95%    BMI 37.70 kg/m    Subjective:    Patient ID: Willie Williams, male    DOB: [birth year], 61 y.o.   MRN: [id number]  Chief Complaint  Patient presents with   Annual Exam    HPI: Willie Williams is a 61 y.o. male  Pt is here for a physical.    Chief Complaint  Patient presents with   Annual Exam    Relevant past medical, surgical, family and social history reviewed and updated as indicated. Interim medical history since our last visit reviewed. Allergies and medications reviewed and updated.  Review of Systems  Constitutional:  Negative for activity change, appetite change, chills, fatigue and fever.  HENT:  Negative for congestion.   Eyes:  Negative for visual disturbance.  Respiratory:  Negative for apnea, cough, chest tightness, shortness of breath and wheezing.   Cardiovascular:  Negative for chest pain, palpitations and leg swelling.  Gastrointestinal:  Negative for abdominal distention, abdominal pain, diarrhea and nausea.  Endocrine: Negative for cold intolerance, heat intolerance, polydipsia, polyphagia and polyuria.  Genitourinary:  Negative for difficulty urinating, dysuria, frequency, hematuria and urgency.  Skin:  Negative for color change and rash.  Neurological:  Negative for dizziness, speech difficulty, weakness, light-headedness, numbness and headaches.  Psychiatric/Behavioral:  Negative for behavioral problems and confusion. The patient is not nervous/anxious.    Per HPI unless specifically indicated above     Objective:    BP (!) 146/96    Pulse (!) 55    Temp 98 F (36.7 C) (Oral)    Ht 5' 10.98" (1.803 m)    Wt 270 lb 3.2 oz (122.6 kg)    SpO2 95%    BMI 37.70 kg/m   Wt Readings from Last 3 Encounters:  12/11/21 270 lb 3.2 oz (122.6 kg)  09/01/21 268 lb (121.6 kg)  12/10/20 274 lb 12.8 oz (124.6 kg)    Physical  Exam Vitals and nursing note reviewed.  Constitutional:      General: He is not in acute distress.    Appearance: Normal appearance. He is not ill-appearing or diaphoretic.  HENT:     Head: Normocephalic and atraumatic.     Right Ear: Tympanic membrane and external ear normal. There is no impacted cerumen.     Left Ear: External ear normal.     Nose: No congestion or rhinorrhea.     Mouth/Throat:     Pharynx: No oropharyngeal exudate or posterior oropharyngeal erythema.  Eyes:     Conjunctiva/sclera: Conjunctivae normal.     Pupils: Pupils are equal, round, and reactive to light.  Cardiovascular:     Rate and Rhythm: Normal rate and regular rhythm.     Heart sounds: No murmur heard.   No friction rub. No gallop.  Pulmonary:     Effort: No respiratory distress.     Breath sounds: No stridor. No wheezing or rhonchi.  Chest:     Chest wall: No tenderness.  Abdominal:     General: Abdomen is flat. Bowel sounds are normal.     Palpations: Abdomen is soft. There is no mass.     Tenderness: There is no abdominal tenderness.  Musculoskeletal:     Cervical back: Normal range of  motion and neck supple. No rigidity or tenderness.     Left lower leg: No edema.  Skin:    General: Skin is warm and dry.  Neurological:     Mental Status: He is alert.    Results for orders placed or performed in visit on 12/10/20  PSA  Result Value Ref Range   Prostate Specific Ag, Serum 0.8 0.0 - 4.0 ng/mL  Lipid Profile  Result Value Ref Range   Cholesterol, Total 142 100 - 199 mg/dL   Triglycerides 325 (H) 0 - 149 mg/dL   HDL 35 (L) >39 mg/dL   VLDL Cholesterol Cal 50 (H) 5 - 40 mg/dL   LDL Chol Calc (NIH) 57 0 - 99 mg/dL   Chol/HDL Ratio 4.1 0.0 - 5.0 ratio  Comp Met (CMET)  Result Value Ref Range   Glucose 83 65 - 99 mg/dL   BUN 12 8 - 27 mg/dL   Creatinine, Ser 0.92 0.76 - 1.27 mg/dL   GFR calc non Af Amer 90 >59 mL/min/1.73   GFR calc Af Amer 104 >59 mL/min/1.73   BUN/Creatinine Ratio 13  10 - 24   Sodium 140 134 - 144 mmol/L   Potassium 4.4 3.5 - 5.2 mmol/L   Chloride 104 96 - 106 mmol/L   CO2 21 20 - 29 mmol/L   Calcium 9.3 8.6 - 10.2 mg/dL   Total Protein 7.2 6.0 - 8.5 g/dL   Albumin 4.9 3.8 - 4.9 g/dL   Globulin, Total 2.3 1.5 - 4.5 g/dL   Albumin/Globulin Ratio 2.1 1.2 - 2.2   Bilirubin Total 1.4 (H) 0.0 - 1.2 mg/dL   Alkaline Phosphatase 59 44 - 121 IU/L   AST 33 0 - 40 IU/L   ALT 51 (H) 0 - 44 IU/L  CBC w/Diff  Result Value Ref Range   WBC 6.7 3.4 - 10.8 x10E3/uL   RBC 5.20 4.14 - 5.80 x10E6/uL   Hemoglobin 16.2 13.0 - 17.7 g/dL   Hematocrit 47.8 37.5 - 51.0 %   MCV 92 79 - 97 fL   MCH 31.2 26.6 - 33.0 pg   MCHC 33.9 31.5 - 35.7 g/dL   RDW 12.9 11.6 - 15.4 %   Platelets 192 150 - 450 x10E3/uL   Neutrophils 48 Not Estab. %   Lymphs 40 Not Estab. %   Monocytes 9 Not Estab. %   Eos 2 Not Estab. %   Basos 1 Not Estab. %   Neutrophils Absolute 3.2 1.4 - 7.0 x10E3/uL   Lymphocytes Absolute 2.7 0.7 - 3.1 x10E3/uL   Monocytes Absolute 0.6 0.1 - 0.9 x10E3/uL   EOS (ABSOLUTE) 0.1 0.0 - 0.4 x10E3/uL   Basophils Absolute 0.1 0.0 - 0.2 x10E3/uL   Immature Granulocytes 0 Not Estab. %   Immature Grans (Abs) 0.0 0.0 - 0.1 x10E3/uL  Microalbumin, Urine Waived  Result Value Ref Range   Microalb, Ur Waived 10 0 - 19 mg/L   Creatinine, Urine Waived 100 10 - 300 mg/dL   Microalb/Creat Ratio <30 <30 mg/g  Hemoglobin A1c  Result Value Ref Range   Hgb A1c MFr Bld 5.8 (H) 4.8 - 5.6 %   Est. average glucose Bld gHb Est-mCnc 120 mg/dL  Specimen status report  Result Value Ref Range   specimen status report Comment         Current Outpatient Medications:    ASPIRIN 81 PO, Take by mouth daily., Disp: , Rfl:    atorvastatin (LIPITOR) 10 MG tablet, Take 1 tablet (  10 mg total) by mouth at bedtime., Disp: 90 tablet, Rfl: 4   meloxicam (MOBIC) 15 MG tablet, Take 1 tablet (15 mg total) by mouth daily., Disp: 90 tablet, Rfl: 1   Multiple Vitamins tablet, Take by mouth.,  Disp: , Rfl:    Omega-3 Fatty Acids (FISH OIL BURP-LESS PO), Take by mouth., Disp: , Rfl:    tiZANidine (ZANAFLEX) 4 MG tablet, tizanidine 4 mg tablet  Take 1 tablet every day by oral route at bedtime., Disp: , Rfl:     Assessment & Plan:   1. Physical PHYSICAL :  Physical Wnl will check CMP, FLP, CBC,TSH, PSA.    Problem List Items Addressed This Visit   None Visit Diagnoses     Annual physical exam    -  Primary   Relevant Orders   PSA Total+%Free (Serial)   Comprehensive metabolic panel   CBC with Differential/Platelet   Thyroid Panel With TSH   Bayer DCA Hb A1c Waived   Lipid panel   Microalbumin, Urine Waived (STAT)        Orders Placed This Encounter  Procedures   PSA Total+%Free (Serial)   Comprehensive metabolic panel   CBC with Differential/Platelet   Thyroid Panel With TSH   Bayer DCA Hb A1c Waived   Lipid panel   Microalbumin, Urine Waived (STAT)     No orders of the defined types were placed in this encounter.    Follow up plan: No follow-ups on file.

## 2021-12-12 LAB — PSA TOTAL+% FREE (SERIAL)
PSA, Free Pct: 41.4 %
PSA, Free: 0.29 ng/mL
Prostate Specific Ag, Serum: 0.7 ng/mL (ref 0.0–4.0)

## 2021-12-12 LAB — COMPREHENSIVE METABOLIC PANEL
ALT: 52 IU/L — ABNORMAL HIGH (ref 0–44)
AST: 47 IU/L — ABNORMAL HIGH (ref 0–40)
Albumin/Globulin Ratio: 2.4 — ABNORMAL HIGH (ref 1.2–2.2)
Albumin: 5.2 g/dL — ABNORMAL HIGH (ref 3.8–4.8)
Alkaline Phosphatase: 66 IU/L (ref 44–121)
BUN/Creatinine Ratio: 13 (ref 10–24)
BUN: 14 mg/dL (ref 8–27)
Bilirubin Total: 1.9 mg/dL — ABNORMAL HIGH (ref 0.0–1.2)
CO2: 21 mmol/L (ref 20–29)
Calcium: 9.7 mg/dL (ref 8.6–10.2)
Chloride: 101 mmol/L (ref 96–106)
Creatinine, Ser: 1.09 mg/dL (ref 0.76–1.27)
Globulin, Total: 2.2 g/dL (ref 1.5–4.5)
Glucose: 92 mg/dL (ref 70–99)
Potassium: 4.5 mmol/L (ref 3.5–5.2)
Sodium: 140 mmol/L (ref 134–144)
Total Protein: 7.4 g/dL (ref 6.0–8.5)
eGFR: 77 mL/min/{1.73_m2} (ref >59)

## 2021-12-12 LAB — CBC WITH DIFFERENTIAL/PLATELET
Basophils Absolute: 0.1 10*3/uL (ref 0.0–0.2)
Basos: 1 %
EOS (ABSOLUTE): 0.1 10*3/uL (ref 0.0–0.4)
Eos: 2 %
Hematocrit: 48.4 % (ref 37.5–51.0)
Hemoglobin: 16.3 g/dL (ref 13.0–17.7)
Immature Grans (Abs): 0 10*3/uL (ref 0.0–0.1)
Immature Granulocytes: 0 %
Lymphocytes Absolute: 2.4 10*3/uL (ref 0.7–3.1)
Lymphs: 40 %
MCH: 30.5 pg (ref 26.6–33.0)
MCHC: 33.7 g/dL (ref 31.5–35.7)
MCV: 91 fL (ref 79–97)
Monocytes Absolute: 0.6 10*3/uL (ref 0.1–0.9)
Monocytes: 10 %
Neutrophils Absolute: 2.8 10*3/uL (ref 1.4–7.0)
Neutrophils: 47 %
Platelets: 192 10*3/uL (ref 150–450)
RBC: 5.35 x10E6/uL (ref 4.14–5.80)
RDW: 12.9 % (ref 11.6–15.4)
WBC: 5.9 10*3/uL (ref 3.4–10.8)

## 2021-12-12 LAB — LIPID PANEL
Chol/HDL Ratio: 3.6 ratio (ref 0.0–5.0)
Cholesterol, Total: 125 mg/dL (ref 100–199)
HDL: 35 mg/dL — ABNORMAL LOW (ref >39)
LDL Chol Calc (NIH): 55 mg/dL (ref 0–99)
Triglycerides: 219 mg/dL — ABNORMAL HIGH (ref 0–149)
VLDL Cholesterol Cal: 35 mg/dL (ref 5–40)

## 2021-12-12 LAB — THYROID PANEL WITH TSH
Free Thyroxine Index: 2.1 (ref 1.2–4.9)
T3 Uptake Ratio: 27 % (ref 24–39)
T4, Total: 7.6 ug/dL (ref 4.5–12.0)
TSH: 1.8 u[IU]/mL (ref 0.450–4.500)

## 2022-01-08 ENCOUNTER — Other Ambulatory Visit: Payer: Self-pay | Admitting: *Deleted

## 2022-01-08 DIAGNOSIS — Z87891 Personal history of nicotine dependence: Secondary | ICD-10-CM

## 2022-01-23 ENCOUNTER — Other Ambulatory Visit: Payer: Self-pay

## 2022-01-23 ENCOUNTER — Ambulatory Visit
Admission: RE | Admit: 2022-01-23 | Discharge: 2022-01-23 | Disposition: A | Discharge status: Home / Self Care | Payer: No Typology Code available for payment source | Source: Ambulatory Visit | Attending: Acute Care | Admitting: Acute Care

## 2022-01-23 DIAGNOSIS — Z87891 Personal history of nicotine dependence: Secondary | ICD-10-CM | POA: Diagnosis present

## 2022-01-27 ENCOUNTER — Other Ambulatory Visit: Payer: Self-pay

## 2022-01-27 DIAGNOSIS — Z87891 Personal history of nicotine dependence: Secondary | ICD-10-CM

## 2022-05-15 IMAGING — CT CT CHEST LUNG CANCER SCREENING LOW DOSE W/O CM
2 of 5 series · 15 of 40 positions shown, 18 images · non-contrast
Comparison: 11/14/2020 screening chest CT.

CLINICAL DATA: 60-year-old asymptomatic male former smoker with 96
pack-year smoking history, quit smoking 6 months prior.



[Series 3: lung 1.00 · axial · 0.76mm/px · z∈[-1209,-910]mm · 12 of 329 slices shown, 15 images]
[im 15/329  mediastinal]
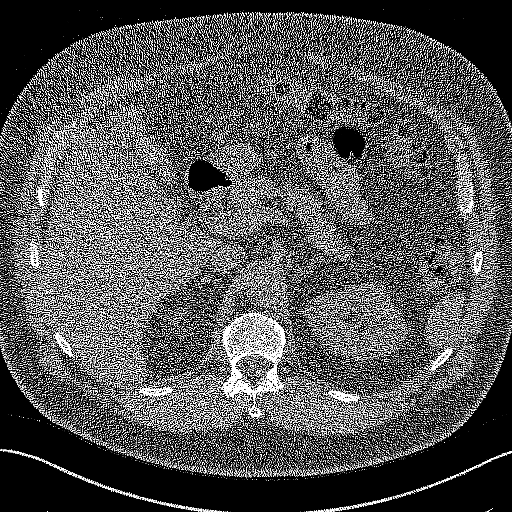
[im 15/329  lung]
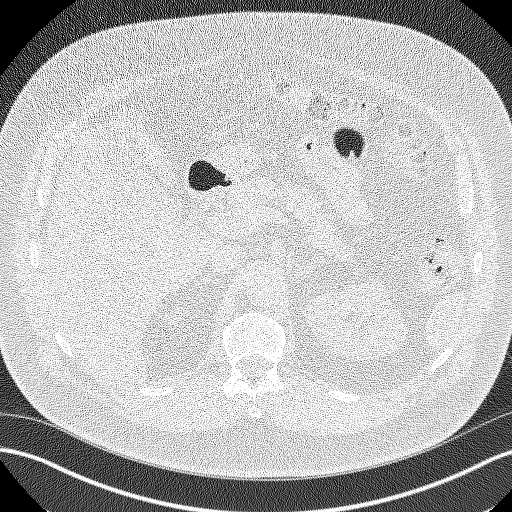
[im 45/329  lung]
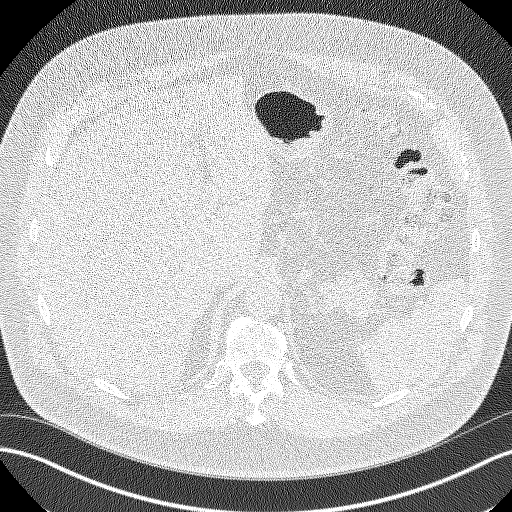
[im 75/329  lung]
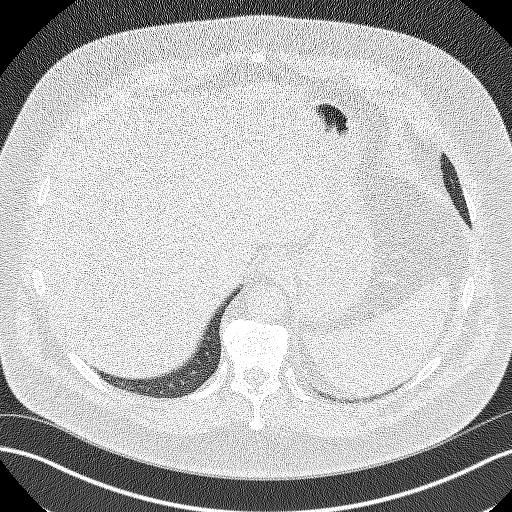
[im 105/329  lung]
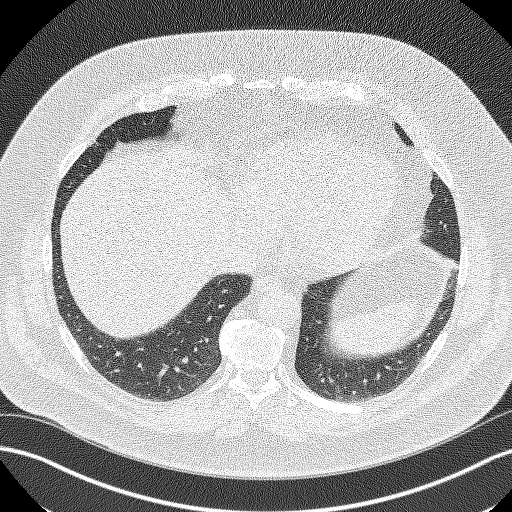
[im 120/329  mediastinal]
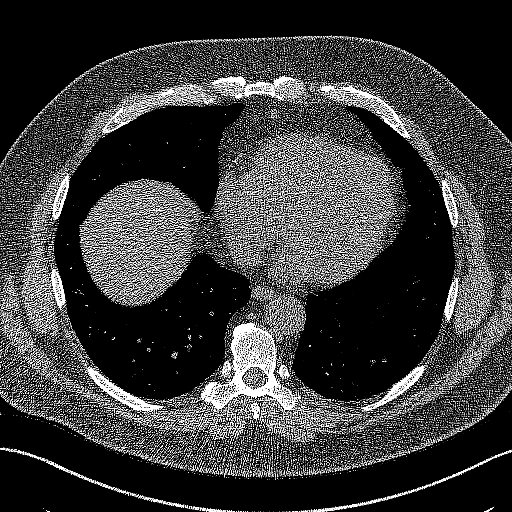
[im 120/329  lung]
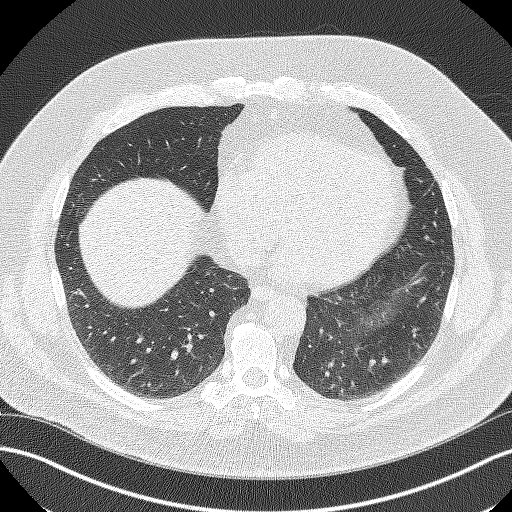
[im 150/329  lung]
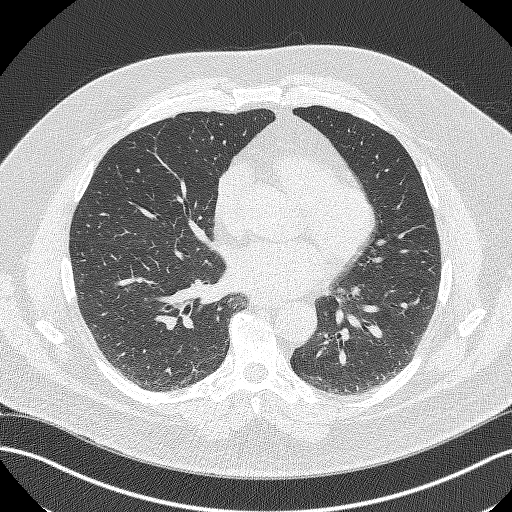
[im 179/329  lung]
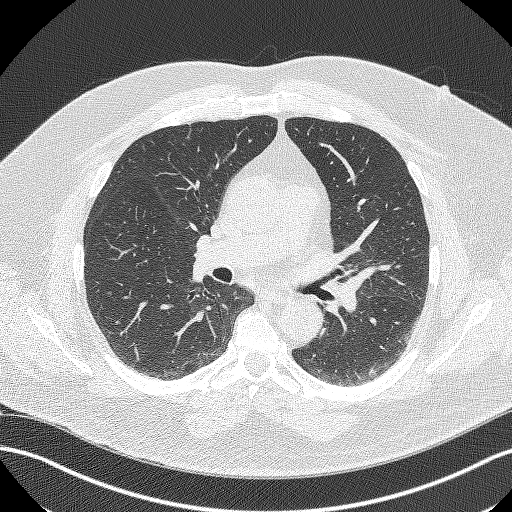
[im 209/329  lung]
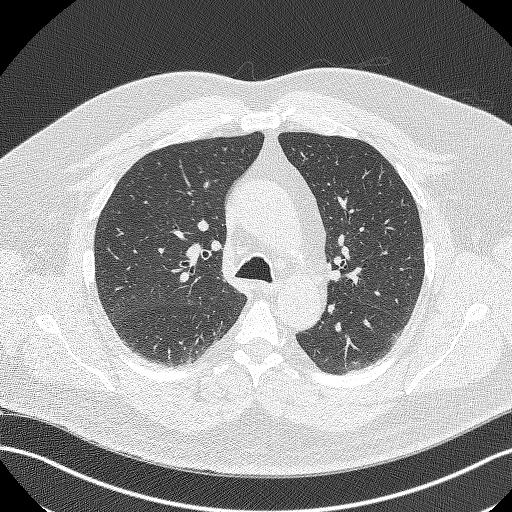
[im 224/329  mediastinal]
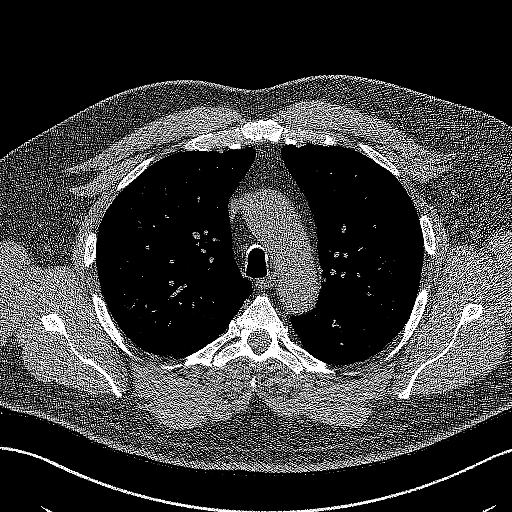
[im 224/329  lung]
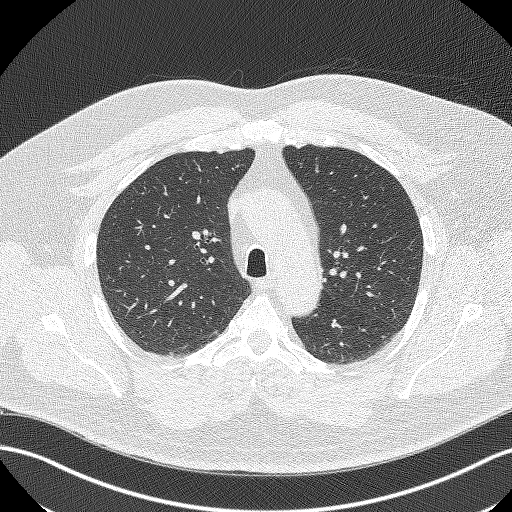
[im 254/329  lung]
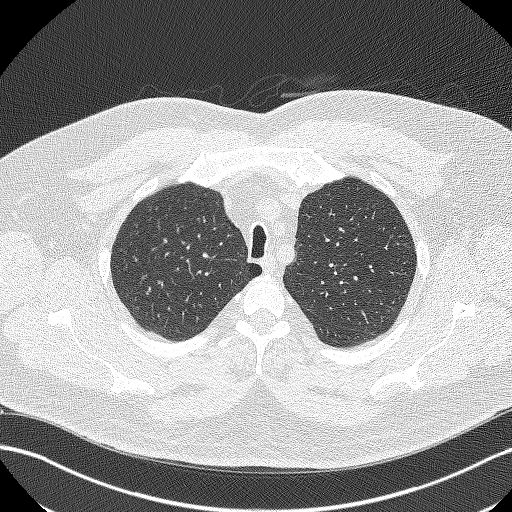
[im 284/329  lung]
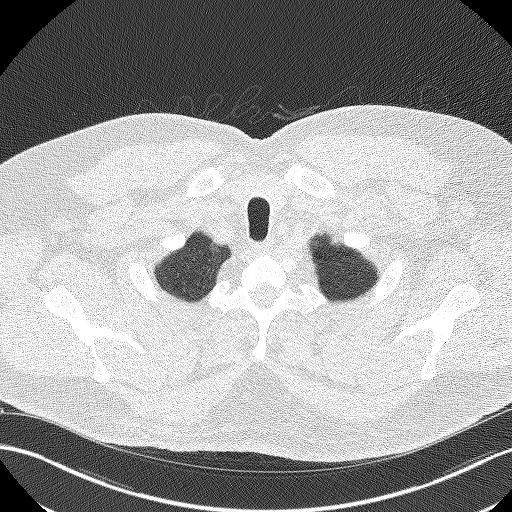
[im 314/329  lung]
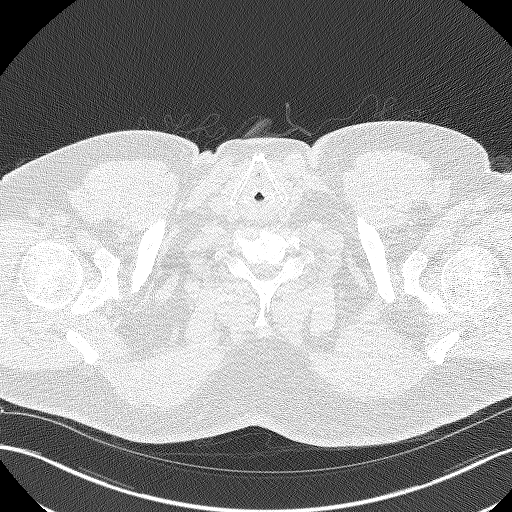

[Series 5: coronals lung 1.00 cor · coronal · 0.64mm/px · 3 of 353 slices shown]
[im 71/353  lung]
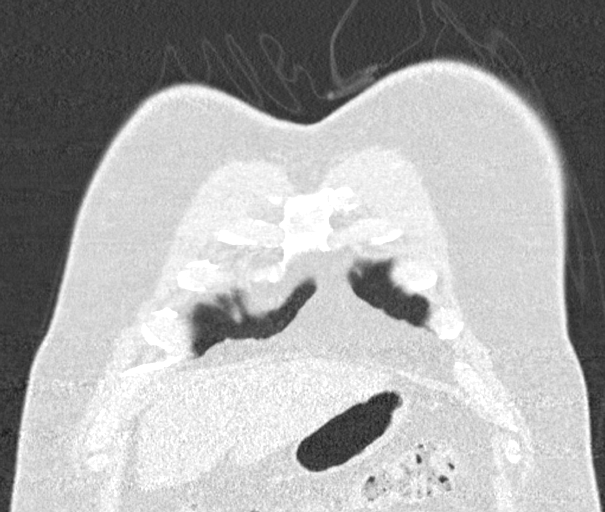
[im 141/353  lung]
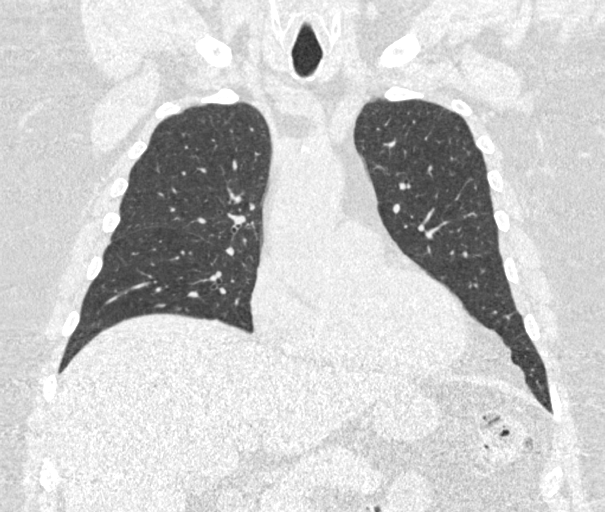
[im 212/353  lung]
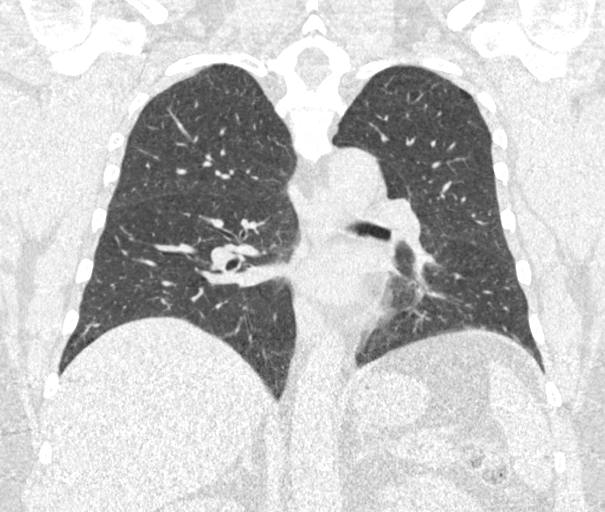

[15 of 40 positions shown; findings below may reference images not displayed]

FINDINGS: Cardiovascular: Normal heart size. No significant pericardial
effusion/thickening. Left anterior descending and right coronary
atherosclerosis. Atherosclerotic thoracic aorta with 4.3 cm dilated
ascending thoracic aorta, stable. Normal caliber pulmonary arteries.

Mediastinum/Nodes: No discrete thyroid nodules. Unremarkable
esophagus. No pathologically enlarged axillary, mediastinal or hilar
lymph nodes, noting limited sensitivity for the detection of hilar
adenopathy on this noncontrast study.

Lungs/Pleura: No pneumothorax. No pleural effusion. Mild paraseptal
and centrilobular emphysema with diffuse bronchial wall thickening.
No acute consolidative airspace disease or lung masses. No
significant growth of previously visualized pulmonary nodules. No
new significant pulmonary nodules.

Upper abdomen: No acute abnormality.

Musculoskeletal: No aggressive appearing focal osseous lesions.
Moderate thoracic spondylosis.
IMPRESSION: 1. Lung-RADS 2, benign appearance or behavior. Continue annual
screening with low-dose chest CT without contrast in 12 months.
2. Two-vessel coronary atherosclerosis.
3. Aortic Atherosclerosis (69OQ1-HC7.7) and Emphysema (69OQ1-066.Z).

## 2022-05-25 ENCOUNTER — Ambulatory Visit (INDEPENDENT_AMBULATORY_CARE_PROVIDER_SITE_OTHER): Payer: No Typology Code available for payment source | Admitting: Nurse Practitioner

## 2022-05-25 ENCOUNTER — Ambulatory Visit: Payer: Self-pay

## 2022-05-25 ENCOUNTER — Encounter: Payer: Self-pay | Admitting: Nurse Practitioner

## 2022-05-25 VITALS — BP 148/88 | HR 63 | Temp 97.7°F | Wt 276.9 lb

## 2022-05-25 DIAGNOSIS — Z1211 Encounter for screening for malignant neoplasm of colon: Secondary | ICD-10-CM

## 2022-05-25 DIAGNOSIS — I10 Essential (primary) hypertension: Secondary | ICD-10-CM | POA: Diagnosis not present

## 2022-05-25 MED ORDER — VALSARTAN 40 MG PO TABS: 40.0000 mg | ORAL_TABLET | Freq: Every day | ORAL | 0 refills | Status: DC

## 2022-05-25 NOTE — Progress Notes (Signed)
BP (!) 148/88   Pulse 63   Temp 97.7 F (36.5 C) (Oral)   Wt 276 lb 14.4 oz (125.6 kg)   SpO2 94%   BMI 38.62 kg/m    Subjective:    Patient ID: Willie Williams, male    DOB: 25-Aug-1961, 61 y.o.   MRN: [id number]  HPI: Willie Williams is a 61 y.o. male  Chief Complaint  Patient presents with   Hypertension    Patient reports having an elevated BP reading during blood draw today.   ELEVATED BLOOD PRESSURE Duration of elevated BP:  states it has been borderline for awhile BP monitoring frequency: not checking BP range:  Previous BP meds: no Recent stressors: no Family history of hypertension: yes Recurrent headaches: yes Visual changes: no Palpitations: no  Dyspnea: no Chest pain: no Lower extremity edema: no Dizzy/lightheaded: no Transient ischemic attacks: no    Relevant past medical, surgical, family and social history reviewed and updated as indicated. Interim medical history since our last visit reviewed. Allergies and medications reviewed and updated.  Review of Systems  Eyes:  Negative for visual disturbance.  Respiratory:  Negative for shortness of breath.   Cardiovascular:  Negative for chest pain and leg swelling.  Neurological:  Positive for headaches. Negative for light-headedness.    Per HPI unless specifically indicated above     Objective:    BP (!) 148/88   Pulse 63   Temp 97.7 F (36.5 C) (Oral)   Wt 276 lb 14.4 oz (125.6 kg)   SpO2 94%   BMI 38.62 kg/m   Wt Readings from Last 3 Encounters:  05/25/22 276 lb 14.4 oz (125.6 kg)  01/23/22 270 lb (122.5 kg)  12/11/21 270 lb 3.2 oz (122.6 kg)    Physical Exam Vitals and nursing note reviewed.  Constitutional:      General: He is not in acute distress.    Appearance: Normal appearance. He is obese. He is not ill-appearing, toxic-appearing or diaphoretic.  HENT:     Head: Normocephalic.     Right Ear: External ear normal.     Left Ear: External ear normal.     Nose: Nose normal.  No congestion or rhinorrhea.     Mouth/Throat:     Mouth: Mucous membranes are moist.  Eyes:     General:        Right eye: No discharge.        Left eye: No discharge.     Extraocular Movements: Extraocular movements intact.     Conjunctiva/sclera: Conjunctivae normal.     Pupils: Pupils are equal, round, and reactive to light.  Cardiovascular:     Rate and Rhythm: Normal rate and regular rhythm.     Heart sounds: No murmur heard. Pulmonary:     Effort: Pulmonary effort is normal. No respiratory distress.     Breath sounds: Normal breath sounds. No wheezing, rhonchi or rales.  Abdominal:     General: Abdomen is flat. Bowel sounds are normal.  Musculoskeletal:     Cervical back: Normal range of motion and neck supple.  Skin:    General: Skin is warm and dry.     Capillary Refill: Capillary refill takes less than 2 seconds.  Neurological:     General: No focal deficit present.     Mental Status: He is alert and oriented to person, place, and time.  Psychiatric:        Mood and Affect: Mood normal.  Behavior: Behavior normal.        Thought Content: Thought content normal.        Judgment: Judgment normal.     Results for orders placed or performed in visit on 12/11/21  Microscopic Examination   Urine  Result Value Ref Range   WBC, UA None seen 0 - 5 /hpf   RBC, Urine 0-2 0 - 2 /hpf   Epithelial Cells (non renal) None seen 0 - 10 /hpf   Mucus, UA Present (A) Not Estab.   Bacteria, UA None seen None seen/Few  Microalbumin, Urine Waived (STAT)  Result Value Ref Range   Microalb, Ur Waived 10 0 - 19 mg/L   Creatinine, Urine Waived 50 10 - 300 mg/dL   Microalb/Creat Ratio 30-300 (H) <30 mg/g  CBC with Differential/Platelet  Result Value Ref Range   WBC 5.9 3.4 - 10.8 x10E3/uL   RBC 5.35 4.14 - 5.80 x10E6/uL   Hemoglobin 16.3 13.0 - 17.7 g/dL   Hematocrit 48.4 37.5 - 51.0 %   MCV 91 79 - 97 fL   MCH 30.5 26.6 - 33.0 pg   MCHC 33.7 31.5 - 35.7 g/dL   RDW 12.9  11.6 - 15.4 %   Platelets 192 150 - 450 x10E3/uL   Neutrophils 47 Not Estab. %   Lymphs 40 Not Estab. %   Monocytes 10 Not Estab. %   Eos 2 Not Estab. %   Basos 1 Not Estab. %   Neutrophils Absolute 2.8 1.4 - 7.0 x10E3/uL   Lymphocytes Absolute 2.4 0.7 - 3.1 x10E3/uL   Monocytes Absolute 0.6 0.1 - 0.9 x10E3/uL   EOS (ABSOLUTE) 0.1 0.0 - 0.4 x10E3/uL   Basophils Absolute 0.1 0.0 - 0.2 x10E3/uL   Immature Granulocytes 0 Not Estab. %   Immature Grans (Abs) 0.0 0.0 - 0.1 x10E3/uL  Comprehensive metabolic panel  Result Value Ref Range   Glucose 92 70 - 99 mg/dL   BUN 14 8 - 27 mg/dL   Creatinine, Ser 1.09 0.76 - 1.27 mg/dL   eGFR 77 >59 mL/min/1.73   BUN/Creatinine Ratio 13 10 - 24   Sodium 140 134 - 144 mmol/L   Potassium 4.5 3.5 - 5.2 mmol/L   Chloride 101 96 - 106 mmol/L   CO2 21 20 - 29 mmol/L   Calcium 9.7 8.6 - 10.2 mg/dL   Total Protein 7.4 6.0 - 8.5 g/dL   Albumin 5.2 (H) 3.8 - 4.8 g/dL   Globulin, Total 2.2 1.5 - 4.5 g/dL   Albumin/Globulin Ratio 2.4 (H) 1.2 - 2.2   Bilirubin Total 1.9 (H) 0.0 - 1.2 mg/dL   Alkaline Phosphatase 66 44 - 121 IU/L   AST 47 (H) 0 - 40 IU/L   ALT 52 (H) 0 - 44 IU/L  Bayer DCA Hb A1c Waived  Result Value Ref Range   HB A1C (BAYER DCA - WAIVED) 5.5 4.8 - 5.6 %  Urinalysis, Routine w reflex microscopic  Result Value Ref Range   Specific Gravity, UA 1.015 1.005 - 1.030   pH, UA 5.5 5.0 - 7.5   Color, UA Yellow Yellow   Appearance Ur Clear Clear   Leukocytes,UA Negative Negative   Protein,UA Negative Negative/Trace   Glucose, UA Negative Negative   Ketones, UA Negative Negative   RBC, UA Trace (A) Negative   Bilirubin, UA Negative Negative   Urobilinogen, Ur 0.2 0.2 - 1.0 mg/dL   Nitrite, UA Negative Negative   Microscopic Examination See below:   Lipid panel  Result Value Ref Range   Cholesterol, Total 125 100 - 199 mg/dL   Triglycerides 219 (H) 0 - 149 mg/dL   HDL 35 (L) >39 mg/dL   VLDL Cholesterol Cal 35 5 - 40 mg/dL   LDL Chol  Calc (NIH) 55 0 - 99 mg/dL   Chol/HDL Ratio 3.6 0.0 - 5.0 ratio  PSA Total+%Free (Serial)  Result Value Ref Range   Prostate Specific Ag, Serum 0.7 0.0 - 4.0 ng/mL   PSA, Free 0.29 N/A ng/mL   PSA, Free Pct 41.4 %  Thyroid Panel With TSH  Result Value Ref Range   TSH 1.800 0.450 - 4.500 uIU/mL   T4, Total 7.6 4.5 - 12.0 ug/dL   T3 Uptake Ratio 27 24 - 39 %   Free Thyroxine Index 2.1 1.2 - 4.9      Assessment & Plan:   Problem List Items Addressed This Visit       Cardiovascular and Mediastinum   Primary hypertension - Primary    Chronic. Not well controlled.  Elevated at visit today.  Per patient has been elevated for the last few years in the 140/80s.  Will start Valsartan 66m daily. Side effects and benefits of medication discussed during visit. Recommend checking blood pressure at home.  Follow up in 2 weeks for reevaluation. Call sooner if concerns arise.       Relevant Medications   valsartan (DIOVAN) 40 MG tablet   Other Visit Diagnoses     Screening for colon cancer       Relevant Orders   Ambulatory referral to Gastroenterology        Follow up plan: Return in about 2 weeks (around 06/08/2022) for BP Check.

## 2022-05-25 NOTE — Assessment & Plan Note (Signed)
Chronic. Not well controlled.  Elevated at visit today.  Per patient has been elevated for the last few years in the 140/80s.  Will start Valsartan '40mg'$  daily. Side effects and benefits of medication discussed during visit. Recommend checking blood pressure at home.  Follow up in 2 weeks for reevaluation. Call sooner if concerns arise.

## 2022-05-25 NOTE — Telephone Encounter (Signed)
     Chief Complaint: Pt. At [employer] today and BP 183/102, 182/101, 178/106. Does nor take BP medication. Symptoms: No symptoms Frequency: Today Pertinent Negatives: Patient denies any symptoms. Disposition: '[]'$ ED /'[]'$ Urgent Care (no appt availability in office) / '[x]'$ Appointment(In office/virtual)/ '[]'$  [facility]/ '[]'$ Home Care/ '[]'$ Refused Recommended Disposition /'[]'$  Mobile Bus/ '[]'$  Follow-up with PCP Additional Notes: Go to ED of symptoms arise.   Reason for Disposition  Systolic BP  >= 010 OR Diastolic >= 932  Answer Assessment - Initial Assessment Questions 1. BLOOD PRESSURE: "What is the blood pressure?" "Did you take at least two measurements 5 minutes apart?"     183/102,, 182/101, 183/99 2. ONSET: "When did you take your blood pressure?"     Today 3. HOW: "How did you take your blood pressure?" (e.g., automatic home BP monitor, visiting nurse)     Nurse 4. HISTORY: "Do you have a history of high blood pressure?"     No 5. MEDICINES: "Are you taking any medicines for blood pressure?" "Have you missed any doses recently?"     No 6. OTHER SYMPTOMS: "Do you have any symptoms?" (e.g., blurred vision, chest pain, difficulty breathing, headache, weakness)     No 7. PREGNANCY: "Is there any chance you are pregnant?" "When was your last menstrual period?"     N/a  Protocols used: Blood Pressure - High-A-AH

## 2022-05-27 ENCOUNTER — Telehealth: Payer: Self-pay

## 2022-05-27 DIAGNOSIS — Z86010 Personal history of colonic polyps: Secondary | ICD-10-CM

## 2022-05-27 NOTE — Telephone Encounter (Signed)
Gastroenterology Pre-Procedure Review  Request Date: TBD Requesting Physician: Dr. Vicente Males  PATIENT REVIEW QUESTIONS: The patient responded to the following health history questions as indicated:    1. Are you having any GI issues? no 2. Do you have a personal history of Polyps? yes (2017 colonoscopy performed by Dr. Vicente Males noted some polyps) 3. Do you have a family history of Colon Cancer or Polyps? no 4. Diabetes Mellitus? no 5. Joint replacements in the past 12 months?no 6. Major health problems in the past 3 months?no 7. Any artificial heart valves, MVP, or defibrillator?no    MEDICATIONS & ALLERGIES:    Patient reports the following regarding taking any anticoagulation/antiplatelet therapy:   Plavix, Coumadin, Eliquis, Xarelto, Lovenox, Pradaxa, Brilinta, or Effient? no Aspirin? no  Patient confirms/reports the following medications:  Current Outpatient Medications  Medication Sig Dispense Refill   ASPIRIN 81 PO Take by mouth daily.     atorvastatin (LIPITOR) 10 MG tablet Take 1 tablet (10 mg total) by mouth at bedtime. 90 tablet 2   Multiple Vitamins tablet Take by mouth.     Omega-3 Fatty Acids (FISH OIL BURP-LESS PO) Take by mouth.     valsartan (DIOVAN) 40 MG tablet Take 1 tablet (40 mg total) by mouth daily. 30 tablet 0   No current facility-administered medications for this visit.    Patient confirms/reports the following allergies:  No Known Allergies  No orders of the defined types were placed in this encounter.   AUTHORIZATION INFORMATION Primary Insurance: 1D#: Group #:  Secondary Insurance: 1D#: Group #:  SCHEDULE INFORMATION: Date: TBD Time: Location: [place]

## 2022-05-28 ENCOUNTER — Telehealth: Payer: Self-pay

## 2022-05-28 NOTE — Telephone Encounter (Signed)
Copied from [place] [id number]. Topic: General - Other >> May 28, 2022 11:12 AM Cyndi Bender wrote: Reason for CRM: Pt requests that a progress note and GI order be faxed to [facility] Attention: Michiel Sites fax# [phone]

## 2022-05-29 NOTE — Telephone Encounter (Signed)
Referral is already in the system.  It is just for a colonoscopy.  Okay to fax the last note.

## 2022-05-29 NOTE — Telephone Encounter (Signed)
Faxed last office note to [phone]

## 2022-06-10 ENCOUNTER — Ambulatory Visit: Payer: No Typology Code available for payment source | Admitting: Internal Medicine

## 2022-06-11 ENCOUNTER — Ambulatory Visit: Payer: No Typology Code available for payment source | Admitting: Nurse Practitioner

## 2022-06-15 ENCOUNTER — Other Ambulatory Visit: Payer: Self-pay | Admitting: Nurse Practitioner

## 2022-06-15 NOTE — Telephone Encounter (Signed)
Requested medication (s) are due for refill today: little early  Requested medication (s) are on the active medication list: yes  Last refill:  05/25/22 #30 with 0 RF  Future visit scheduled: 07/01/22  Notes to clinic:  requesting a little early but will run out before his upcoming visit. Please assess.      Requested Prescriptions  Pending Prescriptions Disp Refills   valsartan (DIOVAN) 40 MG tablet [Pharmacy Med Name: Valsartan 40 MG Oral Tablet] 30 tablet 0    Sig: Take 1 tablet by mouth once daily     Cardiovascular:  Angiotensin Receptor Blockers Failed - 06/15/2022  9:16 AM      Failed - Cr in normal range and within 180 days    Creatinine, Ser  Date Value Ref Range Status  12/11/2021 1.09 0.76 - 1.27 mg/dL Final         Failed - K in normal range and within 180 days    Potassium  Date Value Ref Range Status  12/11/2021 4.5 3.5 - 5.2 mmol/L Final         Failed - Last BP in normal range    BP Readings from Last 1 Encounters:  05/25/22 (!) 148/88         Passed - Patient is not pregnant      Passed - Valid encounter within last 6 months    Recent Outpatient Visits           3 weeks ago Primary hypertension   [place], Karen, NP   6 months ago Annual physical exam   [facility] Vigg, Avanti, MD   1 year ago Centrilobular emphysema ([place])   [place], Boswell T, NP   2 years ago Centrilobular emphysema ([place])   [place], Barbaraann Faster, NP   2 years ago Annual physical exam   [place] Venita Lick, NP       Future Appointments             In 2 weeks Jon Billings, NP [facility], [place]

## 2022-07-01 ENCOUNTER — Encounter: Payer: Self-pay | Admitting: Nurse Practitioner

## 2022-07-01 ENCOUNTER — Ambulatory Visit (INDEPENDENT_AMBULATORY_CARE_PROVIDER_SITE_OTHER): Payer: No Typology Code available for payment source | Admitting: Nurse Practitioner

## 2022-07-01 VITALS — BP 129/83 | HR 60 | Temp 98.3°F | Wt 275.0 lb

## 2022-07-01 DIAGNOSIS — K589 Irritable bowel syndrome without diarrhea: Secondary | ICD-10-CM | POA: Insufficient documentation

## 2022-07-01 DIAGNOSIS — I10 Essential (primary) hypertension: Secondary | ICD-10-CM

## 2022-07-01 DIAGNOSIS — K581 Irritable bowel syndrome with constipation: Secondary | ICD-10-CM | POA: Diagnosis not present

## 2022-07-01 MED ORDER — VALSARTAN 40 MG PO TABS: 40.0000 mg | ORAL_TABLET | Freq: Every day | ORAL | 1 refills | Status: DC

## 2022-07-01 MED ORDER — LINACLOTIDE 72 MCG PO CAPS
72.0000 ug | ORAL_CAPSULE | Freq: Every day | ORAL | 1 refills | Status: DC
Start: 2022-07-01 — End: 2023-01-01

## 2022-07-01 NOTE — Progress Notes (Signed)
BP 129/83   Pulse 60   Temp 98.3 F (36.8 C) (Oral)   Wt 124.7 kg   SpO2 94%   BMI 38.35 kg/m    Subjective:    Patient ID: Willie Williams, male    DOB: 1960-12-07, 61 y.o.   MRN: [id number]  HPI: [name] is a 61 y.o. male  Chief Complaint  Patient presents with   Hypertension    2 week follow up - patient states he was given a sample of Linzess, he was content with it. Would like to see if PCP can prescribe linzess.    NOTE WRITTEN BY DNP STUDENT.  ASSESSMENT AND PLAN OF CARE REVIEWED WITH STUDENT, AGREE WITH ABOVE FINDINGS AND PLAN.  HYPERTENSION without Chronic Kidney Disease Hypertension status: controlled  Satisfied with current treatment? yes Duration of hypertension: months BP monitoring frequency:  a few times a week BP range: 149-702O systolic; taken different times a day BP medication side effects:  no Medication compliance: excellent compliance Previous BP meds:valsartan Aspirin: yes Recurrent headaches: yes; related to right eye Visual changes: no Palpitations:No Dyspnea: no Chest pain: no Lower extremity edema: no Dizzy/lightheaded: no  Patient recently saw GI for evaluation for Colonoscopy.  States he was given samples of Linzess that have helped him have a BM daily vs 2-3 times weekly.  States he has been tolerating the medication well.  Would like to know if it can be prescribed by this office.     Relevant past medical, surgical, family and social history reviewed and updated as indicated. Interim medical history since our last visit reviewed. Allergies and medications reviewed and updated.  Review of Systems  Eyes:  Positive for pain (Right eye). Negative for visual disturbance.  Respiratory:  Positive for chest tightness. Negative for cough and shortness of breath.   Cardiovascular:  Positive for leg swelling. Negative for chest pain.  Gastrointestinal:  Positive for constipation.  Neurological:  Positive for headaches. Negative for  dizziness and light-headedness.    Per HPI unless specifically indicated above     Objective:    BP 129/83   Pulse 60   Temp 98.3 F (36.8 C) (Oral)   Wt 124.7 kg   SpO2 94%   BMI 38.35 kg/m   Wt Readings from Last 3 Encounters:  07/01/22 124.7 kg  05/25/22 125.6 kg  01/23/22 122.5 kg    Physical Exam Constitutional:      General: He is not in acute distress.    Appearance: Normal appearance. He is obese. He is not ill-appearing.  Eyes:     Pupils: Pupils are equal, round, and reactive to light.  Cardiovascular:     Rate and Rhythm: Normal rate and regular rhythm.     Heart sounds: No murmur heard.    No friction rub. No gallop.  Pulmonary:     Effort: Pulmonary effort is normal. No respiratory distress.     Breath sounds: Normal breath sounds.  Abdominal:     General: Bowel sounds are normal.     Palpations: Abdomen is soft.     Tenderness: There is no abdominal tenderness.  Musculoskeletal:     Right lower leg: Edema (+1) present.     Left lower leg: Edema (+1) present.  Skin:    General: Skin is warm and dry.  Neurological:     Mental Status: He is alert.  Psychiatric:        Mood and Affect: Mood normal.  Behavior: Behavior normal.        Thought Content: Thought content normal.        Judgment: Judgment normal.     Results for orders placed or performed in visit on 12/11/21  Microscopic Examination   Urine  Result Value Ref Range   WBC, UA None seen 0 - 5 /hpf   RBC, Urine 0-2 0 - 2 /hpf   Epithelial Cells (non renal) None seen 0 - 10 /hpf   Mucus, UA Present (A) Not Estab.   Bacteria, UA None seen None seen/Few  Microalbumin, Urine Waived (STAT)  Result Value Ref Range   Microalb, Ur Waived 10 0 - 19 mg/L   Creatinine, Urine Waived 50 10 - 300 mg/dL   Microalb/Creat Ratio 30-300 (H) <30 mg/g  CBC with Differential/Platelet  Result Value Ref Range   WBC 5.9 3.4 - 10.8 x10E3/uL   RBC 5.35 4.14 - 5.80 x10E6/uL   Hemoglobin 16.3 13.0 -  17.7 g/dL   Hematocrit 48.4 37.5 - 51.0 %   MCV 91 79 - 97 fL   MCH 30.5 26.6 - 33.0 pg   MCHC 33.7 31.5 - 35.7 g/dL   RDW 12.9 11.6 - 15.4 %   Platelets 192 150 - 450 x10E3/uL   Neutrophils 47 Not Estab. %   Lymphs 40 Not Estab. %   Monocytes 10 Not Estab. %   Eos 2 Not Estab. %   Basos 1 Not Estab. %   Neutrophils Absolute 2.8 1.4 - 7.0 x10E3/uL   Lymphocytes Absolute 2.4 0.7 - 3.1 x10E3/uL   Monocytes Absolute 0.6 0.1 - 0.9 x10E3/uL   EOS (ABSOLUTE) 0.1 0.0 - 0.4 x10E3/uL   Basophils Absolute 0.1 0.0 - 0.2 x10E3/uL   Immature Granulocytes 0 Not Estab. %   Immature Grans (Abs) 0.0 0.0 - 0.1 x10E3/uL  Comprehensive metabolic panel  Result Value Ref Range   Glucose 92 70 - 99 mg/dL   BUN 14 8 - 27 mg/dL   Creatinine, Ser 1.09 0.76 - 1.27 mg/dL   eGFR 77 >59 mL/min/1.73   BUN/Creatinine Ratio 13 10 - 24   Sodium 140 134 - 144 mmol/L   Potassium 4.5 3.5 - 5.2 mmol/L   Chloride 101 96 - 106 mmol/L   CO2 21 20 - 29 mmol/L   Calcium 9.7 8.6 - 10.2 mg/dL   Total Protein 7.4 6.0 - 8.5 g/dL   Albumin 5.2 (H) 3.8 - 4.8 g/dL   Globulin, Total 2.2 1.5 - 4.5 g/dL   Albumin/Globulin Ratio 2.4 (H) 1.2 - 2.2   Bilirubin Total 1.9 (H) 0.0 - 1.2 mg/dL   Alkaline Phosphatase 66 44 - 121 IU/L   AST 47 (H) 0 - 40 IU/L   ALT 52 (H) 0 - 44 IU/L  Bayer DCA Hb A1c Waived  Result Value Ref Range   HB A1C (BAYER DCA - WAIVED) 5.5 4.8 - 5.6 %  Urinalysis, Routine w reflex microscopic  Result Value Ref Range   Specific Gravity, UA 1.015 1.005 - 1.030   pH, UA 5.5 5.0 - 7.5   Color, UA Yellow Yellow   Appearance Ur Clear Clear   Leukocytes,UA Negative Negative   Protein,UA Negative Negative/Trace   Glucose, UA Negative Negative   Ketones, UA Negative Negative   RBC, UA Trace (A) Negative   Bilirubin, UA Negative Negative   Urobilinogen, Ur 0.2 0.2 - 1.0 mg/dL   Nitrite, UA Negative Negative   Microscopic Examination See below:   Lipid panel  Result Value Ref Range   Cholesterol, Total  125 100 - 199 mg/dL   Triglycerides 219 (H) 0 - 149 mg/dL   HDL 35 (L) >39 mg/dL   VLDL Cholesterol Cal 35 5 - 40 mg/dL   LDL Chol Calc (NIH) 55 0 - 99 mg/dL   Chol/HDL Ratio 3.6 0.0 - 5.0 ratio  PSA Total+%Free (Serial)  Result Value Ref Range   Prostate Specific Ag, Serum 0.7 0.0 - 4.0 ng/mL   PSA, Free 0.29 N/A ng/mL   PSA, Free Pct 41.4 %  Thyroid Panel With TSH  Result Value Ref Range   TSH 1.800 0.450 - 4.500 uIU/mL   T4, Total 7.6 4.5 - 12.0 ug/dL   T3 Uptake Ratio 27 24 - 39 %   Free Thyroxine Index 2.1 1.2 - 4.9      Assessment & Plan:   Problem List Items Addressed This Visit       Cardiovascular and Mediastinum   Primary hypertension - Primary    Reviewed pt blood pressure log and reviewed possible side effects.  No changes to medication regimen. Will continue to follow up with pt blood pressure readings.      Relevant Medications   valsartan (DIOVAN) 40 MG tablet     Digestive   IBS (irritable bowel syndrome)    Pt discussed desire to try linzess to relieve his symptoms of IBS w/ constipation.  Pt recently given sample at Gastroenterology office following colonoscopy.  Prescribing linzess this visit. Pt advised on initial diarrhea to be expected when initiating medication.       Relevant Medications   linaclotide (LINZESS) 72 MCG capsule     Follow up plan: Return in about 6 months (around 12/31/2022), or if symptoms worsen or fail to improve, for  blood pressure and kidney function check.

## 2022-07-01 NOTE — Assessment & Plan Note (Signed)
Reviewed pt blood pressure log and reviewed possible side effects.  No changes to medication regimen. Will continue to follow up with pt blood pressure readings.

## 2022-07-01 NOTE — Assessment & Plan Note (Signed)
Pt discussed desire to try linzess to relieve his symptoms of IBS w/ constipation.  Pt recently given sample at Gastroenterology office following colonoscopy.  Prescribing linzess this visit. Pt advised on initial diarrhea to be expected when initiating medication.

## 2022-10-06 ENCOUNTER — Telehealth: Payer: Self-pay

## 2022-10-06 NOTE — Telephone Encounter (Signed)
Paperwork faxed back to [facility]   

## 2022-12-04 ENCOUNTER — Other Ambulatory Visit: Payer: Self-pay | Admitting: Nurse Practitioner

## 2022-12-07 NOTE — Telephone Encounter (Signed)
Requested medication (s) are due for refill today:   Yes  Requested medication (s) are on the active medication list:   Yes  Future visit scheduled:   Yes   Last ordered: 07/01/2022 #90, 1 refill  Returned because Cr and potassium due per protocol   Requested Prescriptions  Pending Prescriptions Disp Refills   valsartan (DIOVAN) 40 MG tablet [Pharmacy Med Name: Valsartan 40 MG Oral Tablet] 90 tablet 0    Sig: Take 1 tablet by mouth once daily     Cardiovascular:  Angiotensin Receptor Blockers Failed - 12/04/2022  9:16 PM      Failed - Cr in normal range and within 180 days    Creatinine, Ser  Date Value Ref Range Status  12/11/2021 1.09 0.76 - 1.27 mg/dL Final         Failed - K in normal range and within 180 days    Potassium  Date Value Ref Range Status  12/11/2021 4.5 3.5 - 5.2 mmol/L Final         Passed - Patient is not pregnant      Passed - Last BP in normal range    BP Readings from Last 1 Encounters:  07/01/22 129/83         Passed - Valid encounter within last 6 months    Recent Outpatient Visits           5 months ago Primary hypertension   [place], Karen, NP   6 months ago Primary hypertension   [place], Karen, NP   12 months ago Annual physical exam   [place] Vigg, Avanti, MD   1 year ago Centrilobular emphysema ([place])   [place], Henrine Screws T, NP   2 years ago Centrilobular emphysema [facility])   [place] Venita Lick, NP       Future Appointments             In 3 weeks Jon Billings, NP [place], [facility]

## 2022-12-09 ENCOUNTER — Other Ambulatory Visit: Payer: Self-pay | Admitting: Nurse Practitioner

## 2022-12-09 DIAGNOSIS — E782 Mixed hyperlipidemia: Secondary | ICD-10-CM

## 2022-12-09 MED ORDER — ATORVASTATIN CALCIUM 10 MG PO TABS: 10.0000 mg | ORAL_TABLET | Freq: Every day | ORAL | 1 refills | Status: DC

## 2022-12-09 NOTE — Telephone Encounter (Signed)
Medication due for refill, has follow up appointment scheduled for 01/01/23

## 2022-12-09 NOTE — Telephone Encounter (Signed)
Pharmacy request new meds ATORVASTATIN 10 MG

## 2022-12-24 ENCOUNTER — Telehealth: Payer: Self-pay

## 2022-12-24 NOTE — Telephone Encounter (Signed)
Paperwork faxed back to [employer]

## 2023-01-01 ENCOUNTER — Ambulatory Visit (INDEPENDENT_AMBULATORY_CARE_PROVIDER_SITE_OTHER): Payer: No Typology Code available for payment source | Admitting: Nurse Practitioner

## 2023-01-01 ENCOUNTER — Encounter: Payer: Self-pay | Admitting: Nurse Practitioner

## 2023-01-01 VITALS — BP 113/68 | HR 52 | Temp 98.6°F | Ht 71.0 in | Wt 273.7 lb

## 2023-01-01 DIAGNOSIS — Z0000 Encounter for general adult medical examination without abnormal findings: Secondary | ICD-10-CM | POA: Diagnosis not present

## 2023-01-01 DIAGNOSIS — J432 Centrilobular emphysema: Secondary | ICD-10-CM

## 2023-01-01 DIAGNOSIS — I700 Atherosclerosis of aorta: Secondary | ICD-10-CM | POA: Diagnosis not present

## 2023-01-01 DIAGNOSIS — E782 Mixed hyperlipidemia: Secondary | ICD-10-CM

## 2023-01-01 DIAGNOSIS — Z6841 Body Mass Index (BMI) 40.0 and over, adult: Secondary | ICD-10-CM

## 2023-01-01 DIAGNOSIS — I10 Essential (primary) hypertension: Secondary | ICD-10-CM

## 2023-01-01 LAB — URINALYSIS, ROUTINE W REFLEX MICROSCOPIC
Bilirubin, UA: NEGATIVE
Glucose, UA: NEGATIVE
Ketones, UA: NEGATIVE
Leukocytes,UA: NEGATIVE
Nitrite, UA: NEGATIVE
Protein,UA: NEGATIVE
Specific Gravity, UA: 1.02 (ref 1.005–1.030)
Urobilinogen, Ur: 0.2 mg/dL (ref 0.2–1.0)
pH, UA: 5 (ref 5.0–7.5)

## 2023-01-01 LAB — MICROSCOPIC EXAMINATION
Bacteria, UA: NONE SEEN
Epithelial Cells (non renal): NONE SEEN /hpf (ref 0–10)

## 2023-01-01 MED ORDER — ATORVASTATIN CALCIUM 20 MG PO TABS: 20.0000 mg | ORAL_TABLET | Freq: Every day | ORAL | 1 refills | Status: DC

## 2023-01-01 MED ORDER — VALSARTAN 40 MG PO TABS: 40.0000 mg | ORAL_TABLET | Freq: Every day | ORAL | 1 refills | Status: DC

## 2023-01-01 NOTE — Progress Notes (Signed)
BP 113/68   Pulse (!) 52   Temp 98.6 F (37 C) (Oral)   Ht '5\' 11"'$  (1.803 m)   Wt 273 lb 11.2 oz (124.1 kg)   SpO2 96%   BMI 38.17 kg/m    Subjective:    Patient ID: Willie Williams, male    DOB: February 22, 1961, 62 y.o.   MRN: [id number]  HPI: Willie Williams is a 62 y.o. male presenting on 01/01/2023 for comprehensive medical examination. Current medical complaints include:none  He currently lives with: Interim Problems from his last visit: no  HYPERTENSION / HYPERLIPIDEMIA Satisfied with current treatment? yes Duration of hypertension: years BP monitoring frequency: not checking BP range:  BP medication side effects: no Past BP meds: valsartan Duration of hyperlipidemia: years Cholesterol medication side effects: no Cholesterol supplements: none Past cholesterol medications: atorvastain (lipitor) Medication compliance: excellent compliance Aspirin: no Recent stressors: no Recurrent headaches: no Visual changes: no Palpitations: no Dyspnea: no Chest pain: no Lower extremity edema: no Dizzy/lightheaded: no   COPD COPD status: controlled Satisfied with current treatment?: yes Oxygen use: no Dyspnea frequency: no Cough frequency: no Rescue inhaler frequency:   Limitation of activity: no Productive cough:  Last Spirometry:  Pneumovax: Up to Date Influenza: Not up to date   Depression Screen done today and results listed below:     01/01/2023    8:15 AM 07/01/2022    1:32 PM 05/25/2022    2:13 PM 12/11/2021    8:42 AM 12/10/2020    9:17 AM  Depression screen PHQ 2/9  Decreased Interest 0 0 0 0 0  Down, Depressed, Hopeless 0 0 0 0 0  PHQ - 2 Score 0 0 0 0 0  Altered sleeping 0 0 0 0 0  Tired, decreased energy 1 0 2 0 0  Change in appetite 0 0 0 0 0  Feeling bad or failure about yourself  0 0 0 0 0  Trouble concentrating 0 0 0 0 0  Moving slowly or fidgety/restless 0 0 0 0 0  Suicidal thoughts 0 0 0 0 0  PHQ-9 Score 1 0 2 0 0  Difficult doing work/chores  Not difficult at all Not difficult at all Not difficult at all Not difficult at all     The patient does not have a history of falls. I did complete a risk assessment for falls. A plan of care for falls was documented.   Past Medical History:  Past Medical History:  Diagnosis Date   ED (erectile dysfunction)    Gilbert's disease    Hyperlipidemia    Myopia    Obesity    Progressive iris atrophy of right eye    Vitreous degeneration of right eye     Surgical History:  Past Surgical History:  Procedure Laterality Date   CARDIAC CATHETERIZATION     COLONOSCOPY W/ POLYPECTOMY  2012   COLONOSCOPY WITH PROPOFOL N/A 10/16/2016   Procedure: COLONOSCOPY WITH PROPOFOL;  Surgeon: Jonathon Bellows, MD;  Location: [facility] ENDOSCOPY;  Service: Endoscopy;  Laterality: N/A;   EYE SURGERY      Medications:  Current Outpatient Medications on File Prior to Visit  Medication Sig   ASPIRIN 81 PO Take by mouth daily.   Multiple Vitamins tablet Take by mouth.   Omega-3 Fatty Acids (FISH OIL BURP-LESS PO) Take by mouth.   No current facility-administered medications on file prior to visit.    Allergies:  No Known Allergies  Social History:  Social History  Socioeconomic History   Marital status: Married    Spouse name: Not on file   Number of children: Not on file   Years of education: Not on file   Highest education level: Not on file  Occupational History   Not on file  Tobacco Use   Smoking status: Some Days    Types: Cigars   Smokeless tobacco: Former    Types: Snuff  Vaping Use   Vaping Use: Never used  Substance and Sexual Activity   Alcohol use: No   Drug use: No   Sexual activity: Not on file  Other Topics Concern   Not on file  Social History Narrative   Not on file   Social Determinants of Health   Financial Resource Strain: Not on file  Food Insecurity: Not on file  Transportation Needs: Not on file  Physical Activity: Not on file  Stress: Not on file  Social  Connections: Not on file  Intimate Partner Violence: Not on file   Social History   Tobacco Use  Smoking Status Some Days   Types: Cigars  Smokeless Tobacco Former   Types: Snuff   Social History   Substance and Sexual Activity  Alcohol Use No    Family History:  Family History  Problem Relation Age of Onset   Cancer Mother    Cancer Father    Heart disease Father     Past medical history, surgical history, medications, allergies, family history and social history reviewed with patient today and changes made to appropriate areas of the chart.   Review of Systems  Eyes:  Negative for blurred vision and double vision.  Respiratory:  Negative for shortness of breath.   Cardiovascular:  Negative for chest pain, palpitations and leg swelling.  Neurological:  Negative for dizziness and headaches.   All other ROS negative except what is listed above and in the HPI.      Objective:    BP 113/68   Pulse (!) 52   Temp 98.6 F (37 C) (Oral)   Ht '5\' 11"'$  (1.803 m)   Wt 273 lb 11.2 oz (124.1 kg)   SpO2 96%   BMI 38.17 kg/m   Wt Readings from Last 3 Encounters:  01/01/23 273 lb 11.2 oz (124.1 kg)  07/01/22 275 lb (124.7 kg)  05/25/22 276 lb 14.4 oz (125.6 kg)    Physical Exam Vitals and nursing note reviewed.  Constitutional:      General: He is not in acute distress.    Appearance: Normal appearance. He is obese. He is not ill-appearing, toxic-appearing or diaphoretic.  HENT:     Head: Normocephalic.     Right Ear: Tympanic membrane, ear canal and external ear normal.     Left Ear: Tympanic membrane, ear canal and external ear normal.     Nose: Nose normal. No congestion or rhinorrhea.     Mouth/Throat:     Mouth: Mucous membranes are moist.  Eyes:     General:        Right eye: No discharge.        Left eye: No discharge.     Extraocular Movements: Extraocular movements intact.     Conjunctiva/sclera: Conjunctivae normal.     Pupils: Pupils are equal, round,  and reactive to light.  Cardiovascular:     Rate and Rhythm: Normal rate and regular rhythm.     Heart sounds: No murmur heard. Pulmonary:     Effort: Pulmonary effort is normal. No respiratory  distress.     Breath sounds: Normal breath sounds. No wheezing, rhonchi or rales.  Abdominal:     General: Abdomen is flat. Bowel sounds are normal. There is no distension.     Palpations: Abdomen is soft.     Tenderness: There is no abdominal tenderness. There is no guarding.  Musculoskeletal:     Cervical back: Normal range of motion and neck supple.  Skin:    General: Skin is warm and dry.     Capillary Refill: Capillary refill takes less than 2 seconds.  Neurological:     General: No focal deficit present.     Mental Status: He is alert and oriented to person, place, and time.     Cranial Nerves: No cranial nerve deficit.     Motor: No weakness.     Deep Tendon Reflexes: Reflexes normal.  Psychiatric:        Mood and Affect: Mood normal.        Behavior: Behavior normal.        Thought Content: Thought content normal.        Judgment: Judgment normal.     Results for orders placed or performed in visit on 12/11/21  Microscopic Examination   Urine  Result Value Ref Range   WBC, UA None seen 0 - 5 /hpf   RBC, Urine 0-2 0 - 2 /hpf   Epithelial Cells (non renal) None seen 0 - 10 /hpf   Mucus, UA Present (A) Not Estab.   Bacteria, UA None seen None seen/Few  Microalbumin, Urine Waived (STAT)  Result Value Ref Range   Microalb, Ur Waived 10 0 - 19 mg/L   Creatinine, Urine Waived 50 10 - 300 mg/dL   Microalb/Creat Ratio 30-300 (H) <30 mg/g  CBC with Differential/Platelet  Result Value Ref Range   WBC 5.9 3.4 - 10.8 x10E3/uL   RBC 5.35 4.14 - 5.80 x10E6/uL   Hemoglobin 16.3 13.0 - 17.7 g/dL   Hematocrit 48.4 37.5 - 51.0 %   MCV 91 79 - 97 fL   MCH 30.5 26.6 - 33.0 pg   MCHC 33.7 31.5 - 35.7 g/dL   RDW 12.9 11.6 - 15.4 %   Platelets 192 150 - 450 x10E3/uL   Neutrophils 47 Not  Estab. %   Lymphs 40 Not Estab. %   Monocytes 10 Not Estab. %   Eos 2 Not Estab. %   Basos 1 Not Estab. %   Neutrophils Absolute 2.8 1.4 - 7.0 x10E3/uL   Lymphocytes Absolute 2.4 0.7 - 3.1 x10E3/uL   Monocytes Absolute 0.6 0.1 - 0.9 x10E3/uL   EOS (ABSOLUTE) 0.1 0.0 - 0.4 x10E3/uL   Basophils Absolute 0.1 0.0 - 0.2 x10E3/uL   Immature Granulocytes 0 Not Estab. %   Immature Grans (Abs) 0.0 0.0 - 0.1 x10E3/uL  Comprehensive metabolic panel  Result Value Ref Range   Glucose 92 70 - 99 mg/dL   BUN 14 8 - 27 mg/dL   Creatinine, Ser 1.09 0.76 - 1.27 mg/dL   eGFR 77 >59 mL/min/1.73   BUN/Creatinine Ratio 13 10 - 24   Sodium 140 134 - 144 mmol/L   Potassium 4.5 3.5 - 5.2 mmol/L   Chloride 101 96 - 106 mmol/L   CO2 21 20 - 29 mmol/L   Calcium 9.7 8.6 - 10.2 mg/dL   Total Protein 7.4 6.0 - 8.5 g/dL   Albumin 5.2 (H) 3.8 - 4.8 g/dL   Globulin, Total 2.2 1.5 - 4.5 g/dL  Albumin/Globulin Ratio 2.4 (H) 1.2 - 2.2   Bilirubin Total 1.9 (H) 0.0 - 1.2 mg/dL   Alkaline Phosphatase 66 44 - 121 IU/L   AST 47 (H) 0 - 40 IU/L   ALT 52 (H) 0 - 44 IU/L  Bayer DCA Hb A1c Waived  Result Value Ref Range   HB A1C (BAYER DCA - WAIVED) 5.5 4.8 - 5.6 %  Urinalysis, Routine w reflex microscopic  Result Value Ref Range   Specific Gravity, UA 1.015 1.005 - 1.030   pH, UA 5.5 5.0 - 7.5   Color, UA Yellow Yellow   Appearance Ur Clear Clear   Leukocytes,UA Negative Negative   Protein,UA Negative Negative/Trace   Glucose, UA Negative Negative   Ketones, UA Negative Negative   RBC, UA Trace (A) Negative   Bilirubin, UA Negative Negative   Urobilinogen, Ur 0.2 0.2 - 1.0 mg/dL   Nitrite, UA Negative Negative   Microscopic Examination See below:   Lipid panel  Result Value Ref Range   Cholesterol, Total 125 100 - 199 mg/dL   Triglycerides 219 (H) 0 - 149 mg/dL   HDL 35 (L) >39 mg/dL   VLDL Cholesterol Cal 35 5 - 40 mg/dL   LDL Chol Calc (NIH) 55 0 - 99 mg/dL   Chol/HDL Ratio 3.6 0.0 - 5.0 ratio  PSA  Total+%Free (Serial)  Result Value Ref Range   Prostate Specific Ag, Serum 0.7 0.0 - 4.0 ng/mL   PSA, Free 0.29 N/A ng/mL   PSA, Free Pct 41.4 %  Thyroid Panel With TSH  Result Value Ref Range   TSH 1.800 0.450 - 4.500 uIU/mL   T4, Total 7.6 4.5 - 12.0 ug/dL   T3 Uptake Ratio 27 24 - 39 %   Free Thyroxine Index 2.1 1.2 - 4.9      Assessment & Plan:   Problem List Items Addressed This Visit       Cardiovascular and Mediastinum   Aortic atherosclerosis ([place])    Noted on lung CT screening, discussed with patient and educated.  Continue statin and ASA daily.  Recommend complete cessation of smoking for prevention.      Relevant Medications   atorvastatin (LIPITOR) 20 MG tablet   valsartan (DIOVAN) 40 MG tablet   Primary hypertension    Chronic.  Controlled.  Continue with current medication regimen of Valsartan '40mg'$  daily.  Refills sent today.  Labs ordered today.  Return to clinic in 6 months for reevaluation.  Call sooner if concerns arise.        Relevant Medications   atorvastatin (LIPITOR) 20 MG tablet   valsartan (DIOVAN) 40 MG tablet     Respiratory   Emphysema of lung ([facility])    Chronic.  Controlled without treatment at this time.  Will continue to reassess.  Can do Spirometry in the future.   Return to clinic in 6 months for reevaluation.  Call sooner if concerns arise.          Other   Hyperlipidemia    Chronic, ongoing.  Recheck lipid panel today.  Atorvastatin increased to '20mg'$ .  Continue current medication regimen and adjust as needed.        Relevant Medications   atorvastatin (LIPITOR) 20 MG tablet   valsartan (DIOVAN) 40 MG tablet   Other Relevant Orders   Lipid panel   Obesity    Recommended eating smaller high protein, low fat meals more frequently and exercising 30 mins a day 5 times a week with a  goal of 10-15lb weight loss in the next 3 months.       Annual physical exam - Primary    Health maintenance reviewed during visit today. Labs ordered.  Vaccines up to date. Colonoscopy up to date. Need to request records.       Relevant Orders   TSH   PSA   Lipid panel   CBC with Differential/Platelet   Comprehensive metabolic panel   Urinalysis, Routine w reflex microscopic     Discussed aspirin prophylaxis for myocardial infarction prevention and decision was made to continue ASA  LABORATORY TESTING:  Health maintenance labs ordered today as discussed above.   The natural history of prostate cancer and ongoing controversy regarding screening and potential treatment outcomes of prostate cancer has been discussed with the patient. The meaning of a false positive PSA and a false negative PSA has been discussed. He indicates understanding of the limitations of this screening test and wishes to proceed with screening PSA testing.   IMMUNIZATIONS:   - Tdap: Tetanus vaccination status reviewed: last tetanus booster within 10 years. - Influenza: Refused - Pneumovax: Not applicable - Prevnar: Not applicable - COVID: Not applicable - HPV: Not applicable - Shingrix vaccine: Up to date  SCREENING: - Colonoscopy: Up to date - need to request records Discussed with patient purpose of the colonoscopy is to detect colon cancer at curable precancerous or early stages   - AAA Screening: Not applicable  -Hearing Test: Not applicable  -Spirometry: Not applicable   PATIENT COUNSELING:    Sexuality: Discussed sexually transmitted diseases, partner selection, use of condoms, avoidance of unintended pregnancy  and contraceptive alternatives.   Advised to avoid cigarette smoking.  I discussed with the patient that most people either abstain from alcohol or drink within safe limits (<=14/week and <=4 drinks/occasion for males, <=7/weeks and <= 3 drinks/occasion for females) and that the risk for alcohol disorders and other health effects rises proportionally with the number of drinks per week and how often a drinker exceeds daily  limits.  Discussed cessation/primary prevention of drug use and availability of treatment for abuse.   Diet: Encouraged to adjust caloric intake to maintain  or achieve ideal body weight, to reduce intake of dietary saturated fat and total fat, to limit sodium intake by avoiding high sodium foods and not adding table salt, and to maintain adequate dietary potassium and calcium preferably from fresh fruits, vegetables, and low-fat dairy products.    stressed the importance of regular exercise  Injury prevention: Discussed safety belts, safety helmets, smoke detector, smoking near bedding or upholstery.   Dental health: Discussed importance of regular tooth brushing, flossing, and dental visits.   Follow up plan: NEXT PREVENTATIVE PHYSICAL DUE IN 1 YEAR. Return in about 6 months (around 07/04/2023) for HTN, HLD, DM2 FU.

## 2023-01-01 NOTE — Assessment & Plan Note (Signed)
Chronic.  Controlled.  Continue with current medication regimen of Valsartan '40mg'$  daily.  Refills sent today.  Labs ordered today.  Return to clinic in 6 months for reevaluation.  Call sooner if concerns arise.

## 2023-01-01 NOTE — Assessment & Plan Note (Signed)
Recommended eating smaller high protein, low fat meals more frequently and exercising 30 mins a day 5 times a week with a goal of 10-15lb weight loss in the next 3 months.  

## 2023-01-01 NOTE — Assessment & Plan Note (Signed)
Noted on lung CT screening, discussed with patient and educated.  Continue statin and ASA daily.  Recommend complete cessation of smoking for prevention.

## 2023-01-01 NOTE — Assessment & Plan Note (Signed)
Health maintenance reviewed during visit today. Labs ordered. Vaccines up to date. Colonoscopy up to date. Need to request records.

## 2023-01-01 NOTE — Assessment & Plan Note (Signed)
Chronic, ongoing.  Recheck lipid panel today.  Atorvastatin increased to '20mg'$ .  Continue current medication regimen and adjust as needed.

## 2023-01-01 NOTE — Assessment & Plan Note (Signed)
Chronic.  Controlled without treatment at this time.  Will continue to reassess.  Can do Spirometry in the future.   Return to clinic in 6 months for reevaluation.  Call sooner if concerns arise.

## 2023-01-02 LAB — CBC WITH DIFFERENTIAL/PLATELET
Basophils Absolute: 0.1 10*3/uL (ref 0.0–0.2)
Basos: 1 %
EOS (ABSOLUTE): 0.1 10*3/uL (ref 0.0–0.4)
Eos: 2 %
Hematocrit: 44.7 % (ref 37.5–51.0)
Hemoglobin: 14.6 g/dL (ref 13.0–17.7)
Immature Grans (Abs): 0 10*3/uL (ref 0.0–0.1)
Immature Granulocytes: 0 %
Lymphocytes Absolute: 2.4 10*3/uL (ref 0.7–3.1)
Lymphs: 45 %
MCH: 29.6 pg (ref 26.6–33.0)
MCHC: 32.7 g/dL (ref 31.5–35.7)
MCV: 91 fL (ref 79–97)
Monocytes Absolute: 0.5 10*3/uL (ref 0.1–0.9)
Monocytes: 10 %
Neutrophils Absolute: 2.2 10*3/uL (ref 1.4–7.0)
Neutrophils: 42 %
Platelets: 197 10*3/uL (ref 150–450)
RBC: 4.93 x10E6/uL (ref 4.14–5.80)
RDW: 13.1 % (ref 11.6–15.4)
WBC: 5.3 10*3/uL (ref 3.4–10.8)

## 2023-01-02 LAB — COMPREHENSIVE METABOLIC PANEL
ALT: 38 IU/L (ref 0–44)
AST: 31 IU/L (ref 0–40)
Albumin/Globulin Ratio: 2.5 — ABNORMAL HIGH (ref 1.2–2.2)
Albumin: 4.9 g/dL (ref 3.9–4.9)
Alkaline Phosphatase: 60 IU/L (ref 44–121)
BUN/Creatinine Ratio: 13 (ref 10–24)
BUN: 13 mg/dL (ref 8–27)
Bilirubin Total: 1.7 mg/dL — ABNORMAL HIGH (ref 0.0–1.2)
CO2: 17 mmol/L — ABNORMAL LOW (ref 20–29)
Calcium: 9.5 mg/dL (ref 8.6–10.2)
Chloride: 105 mmol/L (ref 96–106)
Creatinine, Ser: 0.97 mg/dL (ref 0.76–1.27)
Globulin, Total: 2 g/dL (ref 1.5–4.5)
Glucose: 95 mg/dL (ref 70–99)
Potassium: 4.4 mmol/L (ref 3.5–5.2)
Sodium: 139 mmol/L (ref 134–144)
Total Protein: 6.9 g/dL (ref 6.0–8.5)
eGFR: 88 mL/min/{1.73_m2} (ref >59)

## 2023-01-02 LAB — LIPID PANEL
Chol/HDL Ratio: 3.4 ratio (ref 0.0–5.0)
Cholesterol, Total: 124 mg/dL (ref 100–199)
HDL: 36 mg/dL — ABNORMAL LOW (ref >39)
LDL Chol Calc (NIH): 54 mg/dL (ref 0–99)
Triglycerides: 207 mg/dL — ABNORMAL HIGH (ref 0–149)
VLDL Cholesterol Cal: 34 mg/dL (ref 5–40)

## 2023-01-02 LAB — TSH: TSH: 2.07 u[IU]/mL (ref 0.450–4.500)

## 2023-01-02 LAB — PSA: Prostate Specific Ag, Serum: 0.6 ng/mL (ref 0.0–4.0)

## 2023-01-04 NOTE — Progress Notes (Signed)
Hi Willie Williams. It was nice to see you last week.  Your lab work looks good.  Your cholesterol remains relatively the same as prior.  No concerns at this time. Continue with your current medication regimen.  Follow up as discussed.  Please let me know if you have any questions.

## 2023-01-26 ENCOUNTER — Ambulatory Visit: Admission: RE | Admit: 2023-01-26 | Payer: No Typology Code available for payment source | Source: Ambulatory Visit

## 2023-02-04 ENCOUNTER — Encounter: Payer: Self-pay | Admitting: Nurse Practitioner

## 2023-02-05 NOTE — Telephone Encounter (Signed)
Called patient to schedule appt he stated the he would give his back some time to get better and wait until his next appt with provider.

## 2023-03-03 ENCOUNTER — Telehealth: Payer: Self-pay | Admitting: Nurse Practitioner

## 2023-03-03 NOTE — Telephone Encounter (Unsigned)
Copied from [id number]. Topic: Appointment Scheduling - Scheduling Inquiry for Clinic >> Mar 03, 2023 11:16 AM Marlow Baars wrote: Reason for CRM: The patient states he does not want to make an appt because he would have to come out of pocket and cannot afford that. He was just hoping to get a 20 day supply of the medicine, Meloxicam he took in the past because he is seeing a chiropractor hoping he doesn't have to have surgery. He just is hoping to reduce inflammation and not take as much ibuprofen. Please assist patient further

## 2023-03-04 NOTE — Telephone Encounter (Signed)
Per [employer] message sent to the patient from Clydie Braun, he would need an appointment to have this prescribed.

## 2023-03-04 NOTE — Telephone Encounter (Signed)
Called patient to schedule appt and he stated that he will get RX for Ortho

## 2023-06-27 ENCOUNTER — Other Ambulatory Visit: Payer: Self-pay | Admitting: Nurse Practitioner

## 2023-06-27 DIAGNOSIS — E782 Mixed hyperlipidemia: Secondary | ICD-10-CM

## 2023-06-29 NOTE — Telephone Encounter (Signed)
Requested Prescriptions  Pending Prescriptions Disp Refills   atorvastatin (LIPITOR) 20 MG tablet [Pharmacy Med Name: Atorvastatin Calcium 20 MG Oral Tablet] 90 tablet 1    Sig: TAKE 1 TABLET BY MOUTH AT BEDTIME     Cardiovascular:  Antilipid - Statins Failed - 06/27/2023  7:50 AM      Failed - Lipid Panel in normal range within the last 12 months    Cholesterol, Total  Date Value Ref Range Status  01/01/2023 124 100 - 199 mg/dL Final   Cholesterol Piccolo, Waived  Date Value Ref Range Status  09/07/2016 131 <200 mg/dL Final    Comment:                            Desirable                <200                         Borderline High      200- 239                         High                     >239    LDL Chol Calc (NIH)  Date Value Ref Range Status  01/01/2023 54 0 - 99 mg/dL Final   HDL  Date Value Ref Range Status  01/01/2023 36 (L) >39 mg/dL Final   Triglycerides  Date Value Ref Range Status  01/01/2023 207 (H) 0 - 149 mg/dL Final   Triglycerides Piccolo,Waived  Date Value Ref Range Status  09/07/2016 206 (H) <150 mg/dL Final    Comment:                            Normal                   <150                         Borderline High     150 - 199                         High                200 - 499                         Very High                >499          Passed - Patient is not pregnant      Passed - Valid encounter within last 12 months    Recent Outpatient Visits           5 months ago Annual physical exam   [place] [facility] Larae Grooms, NP   12 months ago Primary hypertension   [place] [facility] Larae Grooms, NP   1 year ago Primary hypertension   [place] [facility] Larae Grooms, NP   1 year ago Annual physical exam   [place] [facility] Vigg, Avanti, MD   2 years ago Centrilobular emphysema [facility])   [place] [facility]  Marjie Skiff,  NP       Future Appointments             In 1 month Larae Grooms, NP [place] [facility]

## 2023-07-14 ENCOUNTER — Other Ambulatory Visit: Payer: Self-pay | Admitting: Nurse Practitioner

## 2023-07-15 NOTE — Telephone Encounter (Signed)
Requested Prescriptions  Pending Prescriptions Disp Refills   valsartan (DIOVAN) 40 MG tablet [Pharmacy Med Name: Valsartan 40 MG Oral Tablet] 90 tablet 0    Sig: Take 1 tablet by mouth once daily     Cardiovascular:  Angiotensin Receptor Blockers Failed - 07/14/2023  6:50 AM      Failed - Cr in normal range and within 180 days    Creatinine, Ser  Date Value Ref Range Status  01/01/2023 0.97 0.76 - 1.27 mg/dL Final         Failed - K in normal range and within 180 days    Potassium  Date Value Ref Range Status  01/01/2023 4.4 3.5 - 5.2 mmol/L Final         Failed - Valid encounter within last 6 months    Recent Outpatient Visits           6 months ago Annual physical exam   [place] [facility] Larae Grooms, NP   1 year ago Primary hypertension   [place] [facility] Larae Grooms, NP   1 year ago Primary hypertension   [place] [facility] Larae Grooms, NP   1 year ago Annual physical exam   [place] [facility] Vigg, Avanti, MD   2 years ago Centrilobular emphysema [facility])   [place] [facility] Marjie Skiff, NP       Future Appointments             In 2 weeks Jackolyn Confer, MD  [facility]            Passed - Patient is not pregnant      Passed - Last BP in normal range    BP Readings from Last 1 Encounters:  01/01/23 113/68

## 2023-08-04 ENCOUNTER — Encounter: Payer: Self-pay | Admitting: Pediatrics

## 2023-08-04 ENCOUNTER — Ambulatory Visit (INDEPENDENT_AMBULATORY_CARE_PROVIDER_SITE_OTHER): Payer: No Typology Code available for payment source | Admitting: Pediatrics

## 2023-08-04 VITALS — BP 120/80 | HR 55 | Temp 97.5°F | Wt 258.0 lb

## 2023-08-04 DIAGNOSIS — E782 Mixed hyperlipidemia: Secondary | ICD-10-CM | POA: Diagnosis not present

## 2023-08-04 DIAGNOSIS — Z1211 Encounter for screening for malignant neoplasm of colon: Secondary | ICD-10-CM

## 2023-08-04 DIAGNOSIS — Z23 Encounter for immunization: Secondary | ICD-10-CM

## 2023-08-04 DIAGNOSIS — I10 Essential (primary) hypertension: Secondary | ICD-10-CM

## 2023-08-04 NOTE — Assessment & Plan Note (Signed)
On atorvastatin. Will repeat levels today. Congratulated on his weight loss.

## 2023-08-04 NOTE — Assessment & Plan Note (Signed)
Well controlled and asymptomatic on valsartan 40mg . Continue current regimen.

## 2023-08-04 NOTE — Patient Instructions (Signed)

## 2023-08-04 NOTE — Progress Notes (Addendum)
   Office Visit  BP 120/80   Pulse (!) 55   Temp (!) 97.5 F (36.4 C) (Oral)   Wt 258 lb (117 kg)   SpO2 96%   BMI 35.98 kg/m    Subjective:    Patient ID: Willie Williams, male    DOB: 20-Jun-1961, 62 y.o.   MRN: [id number]  HPI: Willie Williams is a 62 y.o. male  Chief Complaint  Patient presents with   Hyperlipidemia   Hypertension   HTN Doing well no concerns No headaches chest pain, sob, palpations, orthopnea, dyspnea, PND, lower extremity edema.  HLD He has made some lifestyle changes with diet See weight trend below: Wt Readings from Last 3 Encounters:  08/04/23 258 lb (117 kg)  01/01/23 273 lb 11.2 oz (124.1 kg)  07/01/22 275 lb (124.7 kg)    Relevant past medical, surgical, family and social history reviewed and updated as indicated. Interim medical history since our last visit reviewed. Allergies and medications reviewed and updated.  ROS per HPI unless specifically indicated above     Objective:    BP 120/80   Pulse (!) 55   Temp (!) 97.5 F (36.4 C) (Oral)   Wt 258 lb (117 kg)   SpO2 96%   BMI 35.98 kg/m   Wt Readings from Last 3 Encounters:  08/04/23 258 lb (117 kg)  01/01/23 273 lb 11.2 oz (124.1 kg)  07/01/22 275 lb (124.7 kg)     Physical Exam Constitutional:      Appearance: Normal appearance.  HENT:     Head: Normocephalic and atraumatic.  Eyes:     Pupils: Pupils are equal, round, and reactive to light.  Cardiovascular:     Rate and Rhythm: Normal rate and regular rhythm.     Pulses: Normal pulses.     Heart sounds: Normal heart sounds.  Pulmonary:     Effort: Pulmonary effort is normal.     Breath sounds: Normal breath sounds.  Musculoskeletal:        General: Normal range of motion.     Cervical back: Normal range of motion.  Skin:    General: Skin is warm and dry.     Capillary Refill: Capillary refill takes less than 2 seconds.  Neurological:     General: No focal deficit present.     Mental Status: He is alert.  Mental status is at baseline.  Psychiatric:        Mood and Affect: Mood normal.        Behavior: Behavior normal.        Assessment & Plan:  Assessment & Plan   Primary hypertension Assessment & Plan: Well controlled and asymptomatic on valsartan 40mg . Continue current regimen.   Mixed hyperlipidemia Assessment & Plan: On atorvastatin. Will repeat levels today. Congratulated on his weight loss.  Orders: -     Lipid panel  Need for vaccination -     Flu vaccine trivalent PF, 6mos and older(Flulaval,Afluria,Fluarix,Fluzone)  Screening for colon cancer Pt is overdue for colonoscopy but concerned about cost. Will place order and have GI f/u regarding exact cost prior to procedure. -     Ambulatory referral to Gastroenterology  Of note, patient has had HR chronically. Low temp today w/o s/s infection, continue to monitor. He is very well appearing on exam.  Follow up plan: Return in about 6 months (around 02/02/2024) for Physical.  Keenan Dimitrov Howell Pringle, MD

## 2023-08-05 LAB — LIPID PANEL
Chol/HDL Ratio: 2.6 {ratio} (ref 0.0–5.0)
Cholesterol, Total: 95 mg/dL — ABNORMAL LOW (ref 100–199)
HDL: 36 mg/dL — ABNORMAL LOW (ref >39)
LDL Chol Calc (NIH): 36 mg/dL (ref 0–99)
Triglycerides: 131 mg/dL (ref 0–149)
VLDL Cholesterol Cal: 23 mg/dL (ref 5–40)

## 2023-08-06 ENCOUNTER — Ambulatory Visit: Payer: No Typology Code available for payment source | Admitting: Pediatrics

## 2023-08-17 ENCOUNTER — Encounter: Payer: Self-pay | Admitting: *Deleted

## 2023-10-10 ENCOUNTER — Other Ambulatory Visit: Payer: Self-pay | Admitting: Nurse Practitioner

## 2023-10-12 NOTE — Telephone Encounter (Signed)
Requested Prescriptions  Pending Prescriptions Disp Refills   valsartan (DIOVAN) 40 MG tablet [Pharmacy Med Name: Valsartan 40 MG Oral Tablet] 90 tablet 0    Sig: Take 1 tablet by mouth once daily     Cardiovascular:  Angiotensin Receptor Blockers Failed - 10/10/2023  7:50 AM      Failed - Cr in normal range and within 180 days    Creatinine, Ser  Date Value Ref Range Status  01/01/2023 0.97 0.76 - 1.27 mg/dL Final         Failed - K in normal range and within 180 days    Potassium  Date Value Ref Range Status  01/01/2023 4.4 3.5 - 5.2 mmol/L Final         Passed - Patient is not pregnant      Passed - Last BP in normal range    BP Readings from Last 1 Encounters:  08/04/23 120/80         Passed - Valid encounter within last 6 months    Recent Outpatient Visits           2 months ago Primary hypertension   [place] [facility] Jackolyn Confer, MD   9 months ago Annual physical exam   [place] [facility] Larae Grooms, NP   1 year ago Primary hypertension   [place] [facility] Larae Grooms, NP   1 year ago Primary hypertension   [place] [facility] Larae Grooms, NP   1 year ago Annual physical exam   [place] [facility] Loura Pardon, MD       Future Appointments             In 5 months Larae Grooms, NP  [facility]

## 2023-12-20 ENCOUNTER — Other Ambulatory Visit: Payer: Self-pay | Admitting: Nurse Practitioner

## 2023-12-20 DIAGNOSIS — E782 Mixed hyperlipidemia: Secondary | ICD-10-CM

## 2023-12-21 NOTE — Telephone Encounter (Signed)
Requested Prescriptions  Pending Prescriptions Disp Refills   atorvastatin (LIPITOR) 20 MG tablet [Pharmacy Med Name: Atorvastatin Calcium 20 MG Oral Tablet] 90 tablet 1    Sig: TAKE 1 TABLET BY MOUTH AT BEDTIME     Cardiovascular:  Antilipid - Statins Failed - 12/21/2023  9:19 AM      Failed - Lipid Panel in normal range within the last 12 months    Cholesterol, Total  Date Value Ref Range Status  08/04/2023 95 (L) 100 - 199 mg/dL Final   Cholesterol Piccolo, Waived  Date Value Ref Range Status  09/07/2016 131 <200 mg/dL Final    Comment:                            Desirable                <200                         Borderline High      200- 239                         High                     >239    LDL Chol Calc (NIH)  Date Value Ref Range Status  08/04/2023 36 0 - 99 mg/dL Final   HDL  Date Value Ref Range Status  08/04/2023 36 (L) >39 mg/dL Final   Triglycerides  Date Value Ref Range Status  08/04/2023 131 0 - 149 mg/dL Final   Triglycerides Piccolo,Waived  Date Value Ref Range Status  09/07/2016 206 (H) <150 mg/dL Final    Comment:                            Normal                   <150                         Borderline High     150 - 199                         High                200 - 499                         Very High                >499          Passed - Patient is not pregnant      Passed - Valid encounter within last 12 months    Recent Outpatient Visits           4 months ago Primary hypertension   [place] [facility] Jackolyn Confer, MD   11 months ago Annual physical exam   [place] [facility] Larae Grooms, NP   1 year ago Primary hypertension   [facility] Larae Grooms, NP   1 year ago Primary hypertension   [place] [facility] Larae Grooms, NP   2 years ago Annual physical exam   [place] [facility]  Loura Pardon, MD        Future Appointments             In 3 months Larae Grooms, NP [facility] [facility]

## 2023-12-23 ENCOUNTER — Encounter: Payer: Self-pay | Admitting: *Deleted

## 2024-02-06 ENCOUNTER — Other Ambulatory Visit: Payer: Self-pay | Admitting: Nurse Practitioner

## 2024-02-07 NOTE — Telephone Encounter (Signed)
 Requested medications are due for refill today.  yes  Requested medications are on the active medications list.  yes  Last refill. 10/12/2023 #90 0 rf  Future visit scheduled.   yes  Notes to clinic.  Labs are expired.    Requested Prescriptions  Pending Prescriptions Disp Refills   valsartan (DIOVAN) 40 MG tablet [Pharmacy Med Name: Valsartan 40 MG Oral Tablet] 90 tablet 0    Sig: Take 1 tablet by mouth once daily     Cardiovascular:  Angiotensin Receptor Blockers Failed - 02/07/2024  5:36 PM      Failed - Cr in normal range and within 180 days    Creatinine, Ser  Date Value Ref Range Status  01/01/2023 0.97 0.76 - 1.27 mg/dL Final         Failed - K in normal range and within 180 days    Potassium  Date Value Ref Range Status  01/01/2023 4.4 3.5 - 5.2 mmol/L Final         Failed - Valid encounter within last 6 months    Recent Outpatient Visits   None     Future Appointments             In 1 month Larae Grooms, NP [place] [facility]            Passed - Patient is not pregnant      Passed - Last BP in normal range    BP Readings from Last 1 Encounters:  08/04/23 120/80

## 2024-04-04 ENCOUNTER — Ambulatory Visit (INDEPENDENT_AMBULATORY_CARE_PROVIDER_SITE_OTHER): Payer: Self-pay | Admitting: Nurse Practitioner

## 2024-04-04 ENCOUNTER — Encounter: Payer: Self-pay | Admitting: Nurse Practitioner

## 2024-04-04 VITALS — BP 125/80 | HR 54 | Ht 71.0 in | Wt 257.0 lb

## 2024-04-04 DIAGNOSIS — E782 Mixed hyperlipidemia: Secondary | ICD-10-CM

## 2024-04-04 DIAGNOSIS — Z0000 Encounter for general adult medical examination without abnormal findings: Secondary | ICD-10-CM

## 2024-04-04 DIAGNOSIS — I10 Essential (primary) hypertension: Secondary | ICD-10-CM

## 2024-04-04 DIAGNOSIS — E66813 Obesity, class 3: Secondary | ICD-10-CM

## 2024-04-04 DIAGNOSIS — I700 Atherosclerosis of aorta: Secondary | ICD-10-CM | POA: Diagnosis not present

## 2024-04-04 DIAGNOSIS — J432 Centrilobular emphysema: Secondary | ICD-10-CM | POA: Diagnosis not present

## 2024-04-04 DIAGNOSIS — Z6841 Body Mass Index (BMI) 40.0 and over, adult: Secondary | ICD-10-CM

## 2024-04-04 MED ORDER — VALSARTAN 40 MG PO TABS: 40.0000 mg | ORAL_TABLET | Freq: Every day | ORAL | 1 refills | Status: DC

## 2024-04-04 MED ORDER — ATORVASTATIN CALCIUM 20 MG PO TABS: 20.0000 mg | ORAL_TABLET | Freq: Every day | ORAL | 1 refills | Status: AC

## 2024-04-04 NOTE — Assessment & Plan Note (Signed)
 Chronic.  Continue with atorvastatin . Refills sent today.  Will repeat levels today. Follow up in 6 months.  Call sooner if concerns arise.

## 2024-04-04 NOTE — Assessment & Plan Note (Signed)
 Health maintenance reviewed during visit today.  Labs ordered.  Vaccines reviewed.  No able to afford Colonoscopy or CT Lung screening at this time.

## 2024-04-04 NOTE — Progress Notes (Signed)
 BP 125/80   Pulse (!) 54   Ht 5\' 11"  (1.803 m)   Wt 257 lb (116.6 kg)   BMI 35.84 kg/m    Subjective:    Patient ID: Willie Williams, male    DOB: Nov 21, 1960, 63 y.o.   MRN: [id number]  HPI: Willie Williams is a 63 y.o. male presenting on 04/04/2024 for comprehensive medical examination. Current medical complaints include:none  He currently lives with: Interim Problems from his last visit: no  HYPERTENSION / HYPERLIPIDEMIA Satisfied with current treatment? yes Duration of hypertension: years BP monitoring frequency: sometimes  BP range: 120/80 BP medication side effects: no Past BP meds: valsartan  Duration of hyperlipidemia: years Cholesterol medication side effects: no Cholesterol supplements: none Past cholesterol medications: atorvastain (lipitor) Medication compliance: excellent compliance Aspirin: no Recent stressors: no Recurrent headaches: no Visual changes: no Palpitations: no Dyspnea: no Chest pain: no Lower extremity edema: no Dizzy/lightheaded: no   COPD Doing well without inhalers. Quit smoking about a year ago.  COPD status: controlled Satisfied with current treatment?: yes Oxygen use: no Dyspnea frequency: no Cough frequency: no Rescue inhaler frequency:   Limitation of activity: no Productive cough:  Last Spirometry:  Pneumovax: Up to Date Influenza: Not up to date   Depression Screen done today and results listed below:     04/04/2024    8:11 AM 08/04/2023    8:15 AM 01/01/2023    8:15 AM 07/01/2022    1:32 PM 05/25/2022    2:13 PM  Depression screen PHQ 2/9  Decreased Interest 0 0 0 0 0  Down, Depressed, Hopeless 0 0 0 0 0  PHQ - 2 Score 0 0 0 0 0  Altered sleeping 0 0 0 0 0  Tired, decreased energy 0 0 1 0 2  Change in appetite 0 0 0 0 0  Feeling bad or failure about yourself  0 0 0 0 0  Trouble concentrating 0 0 0 0 0  Moving slowly or fidgety/restless 0 0 0 0 0  Suicidal thoughts 0 0 0 0 0  PHQ-9 Score 0 0 1 0 2  Difficult  doing work/chores  Not difficult at all Not difficult at all Not difficult at all Not difficult at all    The patient does not have a history of falls. I did complete a risk assessment for falls. A plan of care for falls was documented.   Past Medical History:  Past Medical History:  Diagnosis Date   ED (erectile dysfunction)    Gilbert's disease    Hyperlipidemia    Myopia    Obesity    Progressive iris atrophy of right eye    Vitreous degeneration of right eye     Surgical History:  Past Surgical History:  Procedure Laterality Date   CARDIAC CATHETERIZATION     COLONOSCOPY W/ POLYPECTOMY  2012   COLONOSCOPY WITH PROPOFOL  N/A 10/16/2016   Procedure: COLONOSCOPY WITH PROPOFOL ;  Surgeon: Luke Salaam, MD;  Location: [facility] ENDOSCOPY;  Service: Endoscopy;  Laterality: N/A;   EYE SURGERY      Medications:  Current Outpatient Medications on File Prior to Visit  Medication Sig   ASPIRIN 81 PO Take by mouth daily.   Multiple Vitamins tablet Take by mouth.   Omega-3 Fatty Acids (FISH OIL BURP-LESS PO) Take by mouth.   dorzolamide-timolol (COSOPT) 2-0.5 % ophthalmic solution Place 1 drop into the right eye 2 (two) times daily.   No current facility-administered medications on file prior  to visit.    Allergies:  No Known Allergies  Social History:  Social History   Socioeconomic History   Marital status: Married    Spouse name: Not on file   Number of children: Not on file   Years of education: Not on file   Highest education level: 12th grade  Occupational History   Not on file  Tobacco Use   Smoking status: Former    Types: Cigars    Quit date: 2024    Years since quitting: 1.4   Smokeless tobacco: Former    Types: Snuff  Vaping Use   Vaping status: Never Used  Substance and Sexual Activity   Alcohol use: No   Drug use: No   Sexual activity: Not on file  Other Topics Concern   Not on file  Social History Narrative   Not on file   Social Drivers of Health    Financial Resource Strain: Low Risk  (08/03/2023)   Overall Financial Resource Strain (CARDIA)    Difficulty of Paying Living Expenses: Not hard at all  Food Insecurity: No Food Insecurity (08/03/2023)   Hunger Vital Sign    Worried About Running Out of Food in the Last Year: Never true    Ran Out of Food in the Last Year: Never true  Transportation Needs: No Transportation Needs (08/03/2023)   PRAPARE - Administrator, Civil Service (Medical): No    Lack of Transportation (Non-Medical): No  Physical Activity: Unknown (08/03/2023)   Exercise Vital Sign    Days of Exercise per Week: 0 days    Minutes of Exercise per Session: Not on file  Stress: No Stress Concern Present (08/03/2023)   [employer] of Occupational Health - Occupational Stress Questionnaire    Feeling of Stress : Not at all  Social Connections: Socially Integrated (08/03/2023)   Social Connection and Isolation Panel [NHANES]    Frequency of Communication with Friends and Family: Three times a week    Frequency of Social Gatherings with Friends and Family: Once a week    Attends Religious Services: More than 4 times per year    Active Member of [employer] or Organizations: Yes    Attends Engineer, structural: More than 4 times per year    Marital Status: Married  Catering manager Violence: Not on file   Social History   Tobacco Use  Smoking Status Former   Types: Cigars   Quit date: 2024   Years since quitting: 1.4  Smokeless Tobacco Former   Types: Snuff   Social History   Substance and Sexual Activity  Alcohol Use No    Family History:  Family History  Problem Relation Age of Onset   Cancer Mother    Cancer Father    Heart disease Father     Past medical history, surgical history, medications, allergies, family history and social history reviewed with patient today and changes made to appropriate areas of the chart.   Review of Systems  Eyes:  Negative for blurred vision and  double vision.  Respiratory:  Negative for shortness of breath.   Cardiovascular:  Negative for chest pain, palpitations and leg swelling.  Neurological:  Negative for dizziness and headaches.   All other ROS negative except what is listed above and in the HPI.      Objective:    BP 125/80   Pulse (!) 54   Ht 5\' 11"  (1.803 m)   Wt 257 lb (116.6 kg)  BMI 35.84 kg/m   Wt Readings from Last 3 Encounters:  04/04/24 257 lb (116.6 kg)  08/04/23 258 lb (117 kg)  01/01/23 273 lb 11.2 oz (124.1 kg)    Physical Exam Vitals and nursing note reviewed.  Constitutional:      General: He is not in acute distress.    Appearance: Normal appearance. He is obese. He is not ill-appearing, toxic-appearing or diaphoretic.  HENT:     Head: Normocephalic.     Right Ear: Tympanic membrane, ear canal and external ear normal.     Left Ear: Tympanic membrane, ear canal and external ear normal.     Nose: Nose normal. No congestion or rhinorrhea.     Mouth/Throat:     Mouth: Mucous membranes are moist.  Eyes:     General:        Right eye: No discharge.        Left eye: No discharge.     Extraocular Movements: Extraocular movements intact.     Conjunctiva/sclera: Conjunctivae normal.     Pupils: Pupils are equal, round, and reactive to light.  Cardiovascular:     Rate and Rhythm: Normal rate and regular rhythm.     Heart sounds: No murmur heard. Pulmonary:     Effort: Pulmonary effort is normal. No respiratory distress.     Breath sounds: Normal breath sounds. No wheezing, rhonchi or rales.  Abdominal:     General: Abdomen is flat. Bowel sounds are normal. There is no distension.     Palpations: Abdomen is soft.     Tenderness: There is no abdominal tenderness. There is no guarding.  Musculoskeletal:     Cervical back: Normal range of motion and neck supple.  Skin:    General: Skin is warm and dry.     Capillary Refill: Capillary refill takes less than 2 seconds.  Neurological:      General: No focal deficit present.     Mental Status: He is alert and oriented to person, place, and time.     Cranial Nerves: No cranial nerve deficit.     Motor: No weakness.     Deep Tendon Reflexes: Reflexes normal.  Psychiatric:        Mood and Affect: Mood normal.        Behavior: Behavior normal.        Thought Content: Thought content normal.        Judgment: Judgment normal.     Results for orders placed or performed in visit on 08/04/23  Lipid Profile   Collection Time: 08/04/23  8:32 AM  Result Value Ref Range   Cholesterol, Total 95 (L) 100 - 199 mg/dL   Triglycerides 098 0 - 149 mg/dL   HDL 36 (L) >11 mg/dL   VLDL Cholesterol Cal 23 5 - 40 mg/dL   LDL Chol Calc (NIH) 36 0 - 99 mg/dL   Chol/HDL Ratio 2.6 0.0 - 5.0 ratio      Assessment & Plan:   Problem List Items Addressed This Visit       Cardiovascular and Mediastinum   Aortic atherosclerosis (HCC)   Noted on lung CT screening, discussed with patient and educated.  Continue statin and ASA daily.  Recommend complete cessation of smoking for prevention.      Relevant Medications   atorvastatin  (LIPITOR) 20 MG tablet   valsartan  (DIOVAN ) 40 MG tablet   Primary hypertension   Chronic.  Controlled.  Continue with current medication regimen of Valsartan  40mg  daily.  Refills sent today.  Labs ordered today.  Return to clinic in 6 months for reevaluation.  Call sooner if concerns arise.       Relevant Medications   atorvastatin  (LIPITOR) 20 MG tablet   valsartan  (DIOVAN ) 40 MG tablet     Respiratory   Emphysema of lung (HCC)   Chronic.  Controlled without medication.  No longer smoking.  Quit about 1 year ago.  Labs ordered today.  Return to clinic in 6 months for reevaluation.  Call sooner if concerns arise.          Other   Hyperlipidemia   Chronic.  Continue with atorvastatin . Refills sent today.  Will repeat levels today. Follow up in 6 months.  Call sooner if concerns arise.       Relevant  Medications   atorvastatin  (LIPITOR) 20 MG tablet   valsartan  (DIOVAN ) 40 MG tablet   Other Relevant Orders   Lipid panel   Obesity   Recommended eating smaller high protein, low fat meals more frequently and exercising 30 mins a day 5 times a week with a goal of 10-15lb weight loss in the next 3 months.        Annual physical exam - Primary   Health maintenance reviewed during visit today.  Labs ordered.  Vaccines reviewed.  No able to afford Colonoscopy or CT Lung screening at this time.      Relevant Orders   TSH   PSA   Lipid panel   CBC with Differential/Platelet   Comprehensive metabolic panel with GFR     Discussed aspirin prophylaxis for myocardial infarction prevention and decision was made to continue ASA  LABORATORY TESTING:  Health maintenance labs ordered today as discussed above.   The natural history of prostate cancer and ongoing controversy regarding screening and potential treatment outcomes of prostate cancer has been discussed with the patient. The meaning of a false positive PSA and a false negative PSA has been discussed. He indicates understanding of the limitations of this screening test and wishes to proceed with screening PSA testing.   IMMUNIZATIONS:   - Tdap: Tetanus vaccination status reviewed: last tetanus booster within 10 years. - Influenza: Refused - Pneumovax: Not applicable - Prevnar: Not applicable - COVID: Not applicable - HPV: Not applicable - Shingrix vaccine: Up to date  SCREENING: - Colonoscopy: Up to date - Can't afford it Discussed with patient purpose of the colonoscopy is to detect colon cancer at curable precancerous or early stages   - AAA Screening: Not applicable  -Hearing Test: Not applicable  -Spirometry: Not applicable   PATIENT COUNSELING:    Sexuality: Discussed sexually transmitted diseases, partner selection, use of condoms, avoidance of unintended pregnancy  and contraceptive alternatives.   Advised to avoid  cigarette smoking.  I discussed with the patient that most people either abstain from alcohol or drink within safe limits (<=14/week and <=4 drinks/occasion for males, <=7/weeks and <= 3 drinks/occasion for females) and that the risk for alcohol disorders and other health effects rises proportionally with the number of drinks per week and how often a drinker exceeds daily limits.  Discussed cessation/primary prevention of drug use and availability of treatment for abuse.   Diet: Encouraged to adjust caloric intake to maintain  or achieve ideal body weight, to reduce intake of dietary saturated fat and total fat, to limit sodium intake by avoiding high sodium foods and not adding table salt, and to maintain adequate dietary potassium and calcium  preferably from fresh fruits,  vegetables, and low-fat dairy products.    stressed the importance of regular exercise  Injury prevention: Discussed safety belts, safety helmets, smoke detector, smoking near bedding or upholstery.   Dental health: Discussed importance of regular tooth brushing, flossing, and dental visits.   Follow up plan: NEXT PREVENTATIVE PHYSICAL DUE IN 1 YEAR. Return in about 6 months (around 10/04/2024) for HTN, HLD, DM2 FU.

## 2024-04-04 NOTE — Assessment & Plan Note (Signed)
Chronic.  Controlled.  Continue with current medication regimen of Valsartan 40mg daily.  Refills sent today.  Labs ordered today.  Return to clinic in 6 months for reevaluation.  Call sooner if concerns arise.   

## 2024-04-04 NOTE — Assessment & Plan Note (Signed)
Noted on lung CT screening, discussed with patient and educated.  Continue statin and ASA daily.  Recommend complete cessation of smoking for prevention. 

## 2024-04-04 NOTE — Assessment & Plan Note (Signed)
 Chronic.  Not able to afford CT Lung screening at this time.

## 2024-04-04 NOTE — Assessment & Plan Note (Signed)
 Chronic.  Controlled without medication.  No longer smoking.  Quit about 1 year ago.  Labs ordered today.  Return to clinic in 6 months for reevaluation.  Call sooner if concerns arise.

## 2024-04-04 NOTE — Assessment & Plan Note (Signed)
 Recommended eating smaller high protein, low fat meals more frequently and exercising 30 mins a day 5 times a week with a goal of 10-15lb weight loss in the next 3 months.

## 2024-04-05 ENCOUNTER — Ambulatory Visit: Payer: Self-pay | Admitting: Nurse Practitioner

## 2024-04-05 LAB — LIPID PANEL
Chol/HDL Ratio: 3 ratio (ref 0.0–5.0)
Cholesterol, Total: 115 mg/dL (ref 100–199)
HDL: 38 mg/dL — ABNORMAL LOW (ref >39)
LDL Chol Calc (NIH): 43 mg/dL (ref 0–99)
Triglycerides: 215 mg/dL — ABNORMAL HIGH (ref 0–149)
VLDL Cholesterol Cal: 34 mg/dL (ref 5–40)

## 2024-04-05 LAB — CBC WITH DIFFERENTIAL/PLATELET
Basophils Absolute: 0.1 10*3/uL (ref 0.0–0.2)
Basos: 1 %
EOS (ABSOLUTE): 0.1 10*3/uL (ref 0.0–0.4)
Eos: 2 %
Hematocrit: 46.7 % (ref 37.5–51.0)
Hemoglobin: 15.3 g/dL (ref 13.0–17.7)
Immature Grans (Abs): 0 10*3/uL (ref 0.0–0.1)
Immature Granulocytes: 0 %
Lymphocytes Absolute: 2.3 10*3/uL (ref 0.7–3.1)
Lymphs: 38 %
MCH: 30.4 pg (ref 26.6–33.0)
MCHC: 32.8 g/dL (ref 31.5–35.7)
MCV: 93 fL (ref 79–97)
Monocytes Absolute: 0.6 10*3/uL (ref 0.1–0.9)
Monocytes: 10 %
Neutrophils Absolute: 3.1 10*3/uL (ref 1.4–7.0)
Neutrophils: 49 %
Platelets: 178 10*3/uL (ref 150–450)
RBC: 5.03 x10E6/uL (ref 4.14–5.80)
RDW: 13.5 % (ref 11.6–15.4)
WBC: 6.2 10*3/uL (ref 3.4–10.8)

## 2024-04-05 LAB — COMPREHENSIVE METABOLIC PANEL WITH GFR
ALT: 26 IU/L (ref 0–44)
AST: 22 IU/L (ref 0–40)
Albumin: 4.6 g/dL (ref 3.9–4.9)
Alkaline Phosphatase: 62 IU/L (ref 44–121)
BUN/Creatinine Ratio: 16 (ref 10–24)
BUN: 14 mg/dL (ref 8–27)
Bilirubin Total: 1.9 mg/dL — ABNORMAL HIGH (ref 0.0–1.2)
CO2: 17 mmol/L — ABNORMAL LOW (ref 20–29)
Calcium: 9.4 mg/dL (ref 8.6–10.2)
Chloride: 105 mmol/L (ref 96–106)
Creatinine, Ser: 0.86 mg/dL (ref 0.76–1.27)
Globulin, Total: 2.5 g/dL (ref 1.5–4.5)
Glucose: 87 mg/dL (ref 70–99)
Potassium: 4.2 mmol/L (ref 3.5–5.2)
Sodium: 140 mmol/L (ref 134–144)
Total Protein: 7.1 g/dL (ref 6.0–8.5)
eGFR: 97 mL/min/{1.73_m2} (ref >59)

## 2024-04-05 LAB — PSA: Prostate Specific Ag, Serum: 0.6 ng/mL (ref 0.0–4.0)

## 2024-04-05 LAB — TSH: TSH: 2.6 u[IU]/mL (ref 0.450–4.500)

## 2024-05-17 ENCOUNTER — Ambulatory Visit: Admitting: Family Medicine

## 2024-10-06 ENCOUNTER — Ambulatory Visit: Admitting: Nurse Practitioner

## 2024-10-09 ENCOUNTER — Ambulatory Visit: Admitting: Nurse Practitioner

## 2024-10-10 NOTE — Discharge Instructions (Signed)

## 2024-10-11 ENCOUNTER — Encounter: Payer: Self-pay | Admitting: Ophthalmology

## 2024-10-12 ENCOUNTER — Encounter: Payer: Self-pay | Admitting: Pediatrics

## 2024-10-12 ENCOUNTER — Other Ambulatory Visit: Payer: Self-pay

## 2024-10-12 ENCOUNTER — Ambulatory Visit: Admitting: Anesthesiology

## 2024-10-12 ENCOUNTER — Telehealth: Payer: Self-pay | Admitting: Nurse Practitioner

## 2024-10-12 ENCOUNTER — Ambulatory Visit: Admitting: Pediatrics

## 2024-10-12 ENCOUNTER — Encounter: Admission: RE | Disposition: A | Payer: Self-pay | Attending: Ophthalmology

## 2024-10-12 ENCOUNTER — Ambulatory Visit
Admission: RE | Admit: 2024-10-12 | Discharge: 2024-10-12 | Disposition: A | Discharge status: Home / Self Care | Attending: Ophthalmology | Admitting: Ophthalmology

## 2024-10-12 VITALS — BP 128/84 | HR 66 | Temp 97.7°F | Ht 70.98 in | Wt 271.8 lb

## 2024-10-12 DIAGNOSIS — E782 Mixed hyperlipidemia: Secondary | ICD-10-CM

## 2024-10-12 DIAGNOSIS — I10 Essential (primary) hypertension: Secondary | ICD-10-CM

## 2024-10-12 DIAGNOSIS — H608X3 Other otitis externa, bilateral: Secondary | ICD-10-CM

## 2024-10-12 HISTORY — PX: CATARACT EXTRACTION W/PHACO: SHX586

## 2024-10-12 SURGERY — PHACOEMULSIFICATION, CATARACT, WITH IOL INSERTION
Anesthesia: Topical | Laterality: Left

## 2024-10-12 MED ORDER — FENTANYL CITRATE (PF) 100 MCG/2ML IJ SOLN
INTRAMUSCULAR | Status: AC
  Filled 2024-10-12: qty 2

## 2024-10-12 MED ORDER — MIDAZOLAM HCL 2 MG/2ML IJ SOLN
INTRAMUSCULAR | Status: AC
  Filled 2024-10-12: qty 2

## 2024-10-12 MED ORDER — TETRACAINE HCL 0.5 % OP SOLN
1.0000 [drp] | OPHTHALMIC | Status: DC | PRN
  Administered 2024-10-12 (×3): 1 [drp] via OPHTHALMIC

## 2024-10-12 MED ORDER — SIGHTPATH DOSE#1 BSS IO SOLN
INTRAOCULAR | Status: DC | PRN
  Administered 2024-10-12: 15 mL via INTRAOCULAR

## 2024-10-12 MED ORDER — FENTANYL CITRATE (PF) 100 MCG/2ML IJ SOLN
INTRAMUSCULAR | Status: DC | PRN
  Administered 2024-10-12 (×2): 50 ug via INTRAVENOUS

## 2024-10-12 MED ORDER — LIDOCAINE HCL (PF) 2 % IJ SOLN
INTRAOCULAR | Status: DC | PRN
  Administered 2024-10-12: 1 mL via INTRAOCULAR

## 2024-10-12 MED ORDER — SIGHTPATH DOSE#1 BSS IO SOLN
INTRAOCULAR | Status: DC | PRN
  Administered 2024-10-12: 72 mL via OPHTHALMIC

## 2024-10-12 MED ORDER — CYCLOPENTOLATE HCL 2 % OP SOLN
OPHTHALMIC | Status: AC
  Filled 2024-10-12: qty 2

## 2024-10-12 MED ORDER — MOXIFLOXACIN HCL 0.5 % OP SOLN
OPHTHALMIC | Status: DC | PRN
  Administered 2024-10-12: .2 mL via OPHTHALMIC

## 2024-10-12 MED ORDER — TETRACAINE HCL 0.5 % OP SOLN
OPHTHALMIC | Status: AC
  Filled 2024-10-12: qty 4

## 2024-10-12 MED ORDER — BRIMONIDINE TARTRATE-TIMOLOL 0.2-0.5 % OP SOLN
OPHTHALMIC | Status: DC | PRN
  Administered 2024-10-12: 1 [drp] via OPHTHALMIC

## 2024-10-12 MED ORDER — SIGHTPATH DOSE#1 NA HYALUR & NA CHOND-NA HYALUR IO KIT
PACK | INTRAOCULAR | Status: DC | PRN
  Administered 2024-10-12: 1 via OPHTHALMIC

## 2024-10-12 MED ORDER — HYDROCORTISONE-ACETIC ACID 1-2 % OT SOLN: 3.0000 [drp] | Freq: Two times a day (BID) | OTIC | 0 refills | Status: AC

## 2024-10-12 MED ORDER — CYCLOPENTOLATE HCL 2 % OP SOLN
1.0000 [drp] | OPHTHALMIC | Status: AC
  Administered 2024-10-12 (×3): 1 [drp] via OPHTHALMIC

## 2024-10-12 MED ORDER — MIDAZOLAM HCL (PF) 2 MG/2ML IJ SOLN
INTRAMUSCULAR | Status: DC | PRN
  Administered 2024-10-12 (×2): 1 mg via INTRAVENOUS

## 2024-10-12 MED ORDER — VALSARTAN 40 MG PO TABS: 40.0000 mg | ORAL_TABLET | Freq: Every day | ORAL | 1 refills | Status: AC

## 2024-10-12 MED ORDER — PHENYLEPHRINE HCL 10 % OP SOLN
1.0000 [drp] | OPHTHALMIC | Status: AC
  Administered 2024-10-12 (×3): 1 [drp] via OPHTHALMIC

## 2024-10-12 MED ORDER — PHENYLEPHRINE HCL 10 % OP SOLN
OPHTHALMIC | Status: AC
  Filled 2024-10-12: qty 5

## 2024-10-12 SURGICAL SUPPLY — 9 items
DISSECTOR HYDRO NUCLEUS 50X22 (MISCELLANEOUS) ×1 IMPLANT
DRSG TEGADERM 2-3/8X2-3/4 SM (GAUZE/BANDAGES/DRESSINGS) ×1 IMPLANT
FEE CATARACT SUITE SIGHTPATH (MISCELLANEOUS) ×1 IMPLANT
GLOVE BIOGEL PI IND STRL 8 (GLOVE) ×1 IMPLANT
GLOVE SURG LX STRL 7.5 STRW (GLOVE) ×1 IMPLANT
GLOVE SURG SYN 6.5 PF PI BL (GLOVE) ×1 IMPLANT
LENS IOL ENVISTA UV+ 17.5 (Intraocular Lens) IMPLANT
NDL FILTER BLUNT 18X1 1/2 (NEEDLE) ×1 IMPLANT
SYR 3ML LL SCALE MARK (SYRINGE) ×1 IMPLANT

## 2024-10-12 NOTE — Telephone Encounter (Unsigned)
 Copied from CRM #[id number]. Topic: Referral - Request for Referral >> Oct 12, 2024  3:44 PM Charlet HERO wrote: Did the patient discuss referral with their provider in the last year? Yes (If No - schedule appointment) (If Yes - send message)  Appointment offered? No  Type of order/referral and detailed reason for visit: Colonscopy  Preference of office, provider, location: N/A in [place]  If referral order, have you been seen by this specialty before? No   Can we respond through MyChart? Yes

## 2024-10-12 NOTE — Transfer of Care (Signed)
 Immediate Anesthesia Transfer of Care Note  Patient: Willie Williams  Procedure(s) Performed: PHACOEMULSIFICATION, CATARACT, WITH IOL INSERTION 8.47, 00:56.2 (Left)  Patient Location: PACU  Anesthesia Type: MAC  Level of Consciousness: awake, alert  and patient cooperative  Airway and Oxygen Therapy: Patient Spontanous Breathing and Patient connected to supplemental oxygen  Post-op Assessment: Post-op Vital signs reviewed, Patient's Cardiovascular Status Stable, Respiratory Function Stable, Patent Airway and No signs of Nausea or vomiting  Post-op Vital Signs: Reviewed and stable  Complications: No notable events documented.

## 2024-10-12 NOTE — Progress Notes (Signed)
 Office Visit  BP 128/84 (BP Location: Left Arm, Patient Position: Sitting, Cuff Size: Large)   Pulse 66   Temp 97.7 F (36.5 C) (Oral)   Ht 5' 10.98 (1.803 m)   Wt 271 lb 12.8 oz (123.3 kg)   SpO2 96%   BMI 37.93 kg/m    Subjective:    Patient ID: Willie Williams, male    DOB: June 17, 1961, 63 y.o.   MRN: [id number]  HPI: Willie Williams is a 63 y.o. male  Chief Complaint  Patient presents with   Ear Drainage    6 month F/u. Patient stated he has constant drainage from both ears whenever he lays on one side or the other.     Discussed the use of AI scribe software for clinical note transcription with the patient, who gave verbal consent to proceed.  History of Present Illness   Willie Williams is a 63 year old male who presents with ear drainage and skin lesions.  He has experienced ear drainage for the past six months, primarily affecting both ears intermittently. The drainage is clear and sticky, causing irritation and sensitivity in the ear canals due to the use of toilet paper for management. The drainage is bothersome enough to wake him at night. No pain is present unless the ears are irritated by cleaning. These symptoms have not occurred prior to the last six months.  He has noticed growths on his back, described as 'stuff growing' and 'weird.' He has had similar lesions removed in the past and is concerned about potential skin cancer due to his use of Roundup. There is no known history of skin cancer. The lesions are not painful, and he has had several removed previously, including a tumor on his back shoulder.  His current medications include atorvastatin  for cholesterol management, and he has a sufficient supply of this medication.      Relevant past medical, surgical, family and social history reviewed and updated as indicated. Interim medical history since our last visit reviewed. Allergies and medications reviewed and updated.  ROS per HPI unless specifically  indicated above     Objective:    BP 128/84 (BP Location: Left Arm, Patient Position: Sitting, Cuff Size: Large)   Pulse 66   Temp 97.7 F (36.5 C) (Oral)   Ht 5' 10.98 (1.803 m)   Wt 271 lb 12.8 oz (123.3 kg)   SpO2 96%   BMI 37.93 kg/m   Wt Readings from Last 3 Encounters:  10/12/24 271 lb 12.8 oz (123.3 kg)  10/12/24 268 lb (121.6 kg)  04/04/24 257 lb (116.6 kg)     Physical Exam Constitutional:      Appearance: Normal appearance.  HENT:     Right Ear: No hemotympanum. Tympanic membrane is not erythematous or retracted.     Left Ear: No hemotympanum. Tympanic membrane is not erythematous or retracted.     Ears:     Comments: Erythematous canals bilaterally with some flakes Pulmonary:     Effort: Pulmonary effort is normal.  Musculoskeletal:        General: Normal range of motion.  Skin:    Comments: Normal skin color  Neurological:     General: No focal deficit present.     Mental Status: He is alert. Mental status is at baseline.  Psychiatric:        Mood and Affect: Mood normal.        Behavior: Behavior normal.  Thought Content: Thought content normal.         04/04/2024    8:11 AM 08/04/2023    8:15 AM 01/01/2023    8:15 AM 07/01/2022    1:32 PM 05/25/2022    2:13 PM  Depression screen PHQ 2/9  Decreased Interest 0 0 0 0 0  Down, Depressed, Hopeless 0 0 0 0 0  PHQ - 2 Score 0 0 0 0 0  Altered sleeping 0 0 0 0 0  Tired, decreased energy 0 0 1 0 2  Change in appetite 0 0 0 0 0  Feeling bad or failure about yourself  0 0 0 0 0  Trouble concentrating 0 0 0 0 0  Moving slowly or fidgety/restless 0 0 0 0 0  Suicidal thoughts 0 0 0 0 0  PHQ-9 Score 0  0  1  0  2   Difficult doing work/chores  Not difficult at all Not difficult at all Not difficult at all Not difficult at all     Data saved with a previous flowsheet row definition       04/04/2024    8:11 AM 08/04/2023    8:15 AM 01/01/2023    8:15 AM 07/01/2022    1:32 PM  GAD 7 : Generalized  Anxiety Score  Nervous, Anxious, on Edge 0 0 0 0  Control/stop worrying 0 0 0 0  Worry too much - different things 0 0 0 0  Trouble relaxing 0 0 0 0  Restless 0 0 0 0  Easily annoyed or irritable 0 0 0 0  Afraid - awful might happen 0 0 0 0  Total GAD 7 Score 0 0 0 0  Anxiety Difficulty  Not difficult at all Not difficult at all Not difficult at all       Assessment & Plan:  Assessment & Plan   Chronic eczematous otitis externa of both ears Bilateral ear drainage with clear sticky discharge due to eczematous changes. No infection present. - Prescribed ear drops, 3 drops, 3-4 times daily for 7 days. - Advised to call if symptoms persist after 7 days. -     Hydrocortisone -Acetic Acid ; Place 3 drops into both ears 2 (two) times daily for 7 days.  Dispense: 2.1 mL; Refill: 0  Primary hypertension Blood pressure management ongoing with current medication regimen. - Sent prescription for blood pressure medication to pharmacy. -     Valsartan ; Take 1 tablet (40 mg total) by mouth daily.  Dispense: 90 tablet; Refill: 1  Mixed hyperlipidemia Cholesterol management ongoing with atorvastatin . 90-day supply available. - Sent prescription for atorvastatin  to pharmacy.   Skin lesion Benign epidermal cyst of back Cyst on back, not infected. Differential includes benign epidermal cyst versus other skin lesions. - Recommended follow-up with dermatology for evaluation and possible biopsy or removal.   Follow up plan: Return in about 6 months (around 04/12/2025) for Physical.  Hadassah SHAUNNA Nett, MD

## 2024-10-12 NOTE — Telephone Encounter (Unsigned)
 Copied from CRM #[id number]. Topic: Referral - Request for Referral >> Oct 12, 2024  9:51 AM Ashley R wrote: Did the patient discuss referral with their provider in the last year? Yes  Appointment offered? Yes, would like to come in this week for ear drainage as well.   Type of order/referral and detailed reason for visit: Colonoscopy  Preference of office, provider, location: soonest available. Would like to schedule before the end of the year.   If referral order, have you been seen by this specialty before? Yes (If Yes, this issue or another issue? When? Where?  Can we respond through MyChart? Yes   ----------------------------------------------------------------------- From previous Reason for Contact - Scheduling: Patient/patient representative is calling to schedule an appointment. Refer to attachments for appointment information.

## 2024-10-12 NOTE — Anesthesia Preprocedure Evaluation (Signed)
 Anesthesia Evaluation  Patient identified by MRN, date of birth, ID band Patient awake    Reviewed: Allergy & Precautions, NPO status , Patient's Chart, lab work & pertinent test results  History of Anesthesia Complications Negative for: history of anesthetic complications  Airway Mallampati: III  TM Distance: >3 FB Neck ROM: full    Dental no notable dental hx.    Pulmonary COPD, former smoker   Pulmonary exam normal        Cardiovascular hypertension, negative cardio ROS Normal cardiovascular exam     Neuro/Psych negative neurological ROS  negative psych ROS   GI/Hepatic negative GI ROS, Neg liver ROS,,,  Endo/Other  negative endocrine ROS    Renal/GU      Musculoskeletal   Abdominal   Peds  Hematology negative hematology ROS (+)   Anesthesia Other Findings Past Medical History: No date: ED (erectile dysfunction) No date: Gilbert's disease No date: Hyperlipidemia No date: Hypertension No date: Myopia No date: Obesity No date: Progressive iris atrophy of right eye No date: Vitreous degeneration of right eye  Past Surgical History: No date: CARDIAC CATHETERIZATION 2012: COLONOSCOPY W/ POLYPECTOMY 10/16/2016: COLONOSCOPY WITH PROPOFOL ; N/A     Comment:  Procedure: COLONOSCOPY WITH PROPOFOL ;  Surgeon: Ruel Kung, MD;  Location: [facility] ENDOSCOPY;  Service: Endoscopy;              Laterality: N/A; No date: EYE SURGERY  BMI    Body Mass Index: 37.38 kg/m      Reproductive/Obstetrics negative OB ROS                              Anesthesia Physical Anesthesia Plan  ASA: 3  Anesthesia Plan: MAC   Post-op Pain Management: Minimal or no pain anticipated   Induction: Intravenous  PONV Risk Score and Plan:   Airway Management Planned: Natural Airway and Nasal Cannula  Additional Equipment:   Intra-op Plan:   Post-operative Plan:   Informed Consent: I  have reviewed the patients History and Physical, chart, labs and discussed the procedure including the risks, benefits and alternatives for the proposed anesthesia with the patient or authorized representative who has indicated his/her understanding and acceptance.     Dental Advisory Given  Plan Discussed with: Anesthesiologist, CRNA and Surgeon  Anesthesia Plan Comments: (Patient consented for risks of anesthesia including but not limited to:  - adverse reactions to medications - damage to eyes, teeth, lips or other oral mucosa - nerve damage due to positioning  - sore throat or hoarseness - Damage to heart, brain, nerves, lungs, other parts of body or loss of life  Patient voiced understanding and assent.)        Anesthesia Quick Evaluation

## 2024-10-12 NOTE — H&P (Signed)
 [facility]   Primary Care Physician:  Melvin Pao, NP Ophthalmologist: Dr. Feliciano Ober  Pre-Procedure History & Physical: HPI:  Willie Williams is a 63 y.o. male here for cataract surgery.   Past Medical History:  Diagnosis Date   ED (erectile dysfunction)    Gilbert's disease    Hyperlipidemia    Hypertension    Myopia    Obesity    Progressive iris atrophy of right eye    Vitreous degeneration of right eye     Past Surgical History:  Procedure Laterality Date   CARDIAC CATHETERIZATION     COLONOSCOPY W/ POLYPECTOMY  2012   COLONOSCOPY WITH PROPOFOL  N/A 10/16/2016   Procedure: COLONOSCOPY WITH PROPOFOL ;  Surgeon: Ruel Kung, MD;  Location: [facility] ENDOSCOPY;  Service: Endoscopy;  Laterality: N/A;   EYE SURGERY      Prior to Admission medications  Medication Sig Start Date End Date Taking? Authorizing Provider  ASPIRIN 81 PO Take by mouth daily.   Yes [provider]  atorvastatin  (LIPITOR) 20 MG tablet Take 1 tablet (20 mg total) by mouth at bedtime. 04/04/24  Yes Melvin Pao, NP  dorzolamide-timolol (COSOPT) 2-0.5 % ophthalmic solution Place 1 drop into the right eye 2 (two) times daily.   Yes [provider]  Multiple Vitamins tablet Take by mouth.   Yes [provider]  Omega-3 Fatty Acids (FISH OIL BURP-LESS PO) Take by mouth.   Yes [provider]  valsartan  (DIOVAN ) 40 MG tablet Take 1 tablet (40 mg total) by mouth daily. 04/04/24  Yes Melvin Pao, NP    Allergies as of 10/02/2024   (No Known Allergies)    Family History  Problem Relation Age of Onset   Cancer Mother    Cancer Father    Heart disease Father     Social History   Socioeconomic History   Marital status: Married    Spouse name: Not on file   Number of children: Not on file   Years of education: Not on file   Highest education level: 12th grade  Occupational History   Not on file  Tobacco Use   Smoking status: Former    Types:  Cigars    Quit date: 2024    Years since quitting: 1.9   Smokeless tobacco: Former    Types: Snuff  Vaping Use   Vaping status: Never Used  Substance and Sexual Activity   Alcohol use: No   Drug use: No   Sexual activity: Not on file  Other Topics Concern   Not on file  Social History Narrative   Not on file   Social Drivers of Health   Tobacco Use: Medium Risk (04/04/2024)   Patient History    Smoking Tobacco Use: Former    Smokeless Tobacco Use: Former    Passive Exposure: Not on Actuary Strain: Low Risk (10/02/2024)   Overall Financial Resource Strain (CARDIA)    Difficulty of Paying Living Expenses: Not hard at all  Food Insecurity: No Food Insecurity (10/02/2024)   Epic    Worried About Radiation Protection Practitioner of Food in the Last Year: Never true    Ran Out of Food in the Last Year: Never true  Transportation Needs: No Transportation Needs (10/02/2024)   Epic    Lack of Transportation (Medical): No    Lack of Transportation (Non-Medical): No  Physical Activity: Inactive (10/02/2024)   Exercise Vital Sign    Days of Exercise per Week: 0 days  Minutes of Exercise per Session: Not on file  Stress: No Stress Concern Present (10/02/2024)   [employer] of Occupational Health - Occupational Stress Questionnaire    Feeling of Stress: Not at all  Social Connections: Socially Integrated (10/02/2024)   Social Connection and Isolation Panel    Frequency of Communication with Friends and Family: More than three times a week    Frequency of Social Gatherings with Friends and Family: Once a week    Attends Religious Services: More than 4 times per year    Active Member of [employer] or Organizations: Yes    Attends Engineer, Structural: More than 4 times per year    Marital Status: Married  Catering Manager Violence: Not on file  Depression (PHQ2-9): Low Risk (04/04/2024)   Depression (PHQ2-9)    PHQ-2 Score: 0  Alcohol Screen: Not on file  Housing: Low Risk  (10/02/2024)   Epic    Unable to Pay for Housing in the Last Year: No    Number of Times Moved in the Last Year: 0    Homeless in the Last Year: No  Utilities: Not on file  Health Literacy: Not on file    Review of Systems: See HPI, otherwise negative ROS  Physical Exam: Ht 5' 11 (1.803 m)   Wt 120.2 kg   BMI 36.96 kg/m  General:   Alert, cooperative in NAD Head:  Normocephalic and atraumatic. Respiratory:  Normal work of breathing. Cardiovascular:  RRR  Impression/Plan: Norleen Willie Williams is here for cataract surgery.  Risks, benefits, limitations, and alternatives regarding cataract surgery have been reviewed with the patient.  Questions have been answered.  All parties agreeable.   Feliciano Bryan Ober, MD  10/12/2024, 7:15 AM

## 2024-10-12 NOTE — Anesthesia Postprocedure Evaluation (Signed)
 Anesthesia Post Note  Patient: Willie Williams  Procedure(s) Performed: PHACOEMULSIFICATION, CATARACT, WITH IOL INSERTION 8.47, 00:56.2 (Left)  Patient location during evaluation: PACU Anesthesia Type: MAC Level of consciousness: awake and alert Pain management: pain level controlled Vital Signs Assessment: post-procedure vital signs reviewed and stable Respiratory status: spontaneous breathing, nonlabored ventilation, respiratory function stable and patient connected to nasal cannula oxygen Cardiovascular status: stable and blood pressure returned to baseline Postop Assessment: no apparent nausea or vomiting Anesthetic complications: no   No notable events documented.   Last Vitals:  Vitals:   10/12/24 1125 10/12/24 1129  BP: (!) 168/99   Pulse: (!) 58 62  Resp: 13 14  Temp:  (!) 36.2 C  SpO2: 93% 93%    Last Pain:  Vitals:   10/12/24 1129  PainSc: 0-No pain                 [name]

## 2024-10-12 NOTE — Op Note (Signed)
 OPERATIVE NOTE  MOODY ROBBEN [id number] 10/12/2024   PREOPERATIVE DIAGNOSIS: Nuclear sclerotic cataract left eye. H25.12   POSTOPERATIVE DIAGNOSIS: Nuclear sclerotic cataract left eye. H25.12   PROCEDURE:  Phacoemulsification with posterior chamber intraocular lens placement of the left eye  Ultrasound time: Procedures: PHACOEMULSIFICATION, CATARACT, WITH IOL INSERTION 8.47, 00:56.2 (Left)  LENS:   Implant Name Type Inv. Item Serial No. Manufacturer Lot No. LRB No. Used Action  LENS IOL ENVISTA UV+ 17.5 - [id number] Intraocular Lens LENS IOL ENVISTA UV+ 17.5 [id number] [facility] [id number] Left 1 Implanted      SURGEON:  Feliciano HERO. Enola, MD   ANESTHESIA:  Topical with tetracaine drops, augmented with 1% preservative-free intracameral lidocaine .   COMPLICATIONS:  None.   DESCRIPTION OF PROCEDURE:  The patient was identified in the holding room and transported to the operating room and placed in the supine position under the operating microscope.  The left eye was identified as the operative eye, which was prepped and draped in the usual sterile ophthalmic fashion.   A 1 millimeter clear-corneal paracentesis was made inferotemporally. Preservative-free 1% lidocaine  mixed with 1:1,000 bisulfite-free aqueous solution of epinephrine was injected into the anterior chamber. The anterior chamber was then filled with Viscoat viscoelastic. A 2.4 millimeter keratome was used to make a clear-corneal incision superotemporally. A curvilinear capsulorrhexis was made with a cystotome and capsulorrhexis forceps. Balanced salt solution was used to hydrodissect and hydrodelineate the nucleus. Phacoemulsification was then used to remove the lens nucleus and epinucleus. The remaining cortex was then removed using the irrigation and aspiration handpiece. Provisc was then placed into the capsular bag to distend it for lens placement. A +17.50 D EE intraocular lens was then injected into the capsular bag. The  remaining viscoelastic was aspirated.   Wounds were hydrated with balanced salt solution.  The anterior chamber was inflated to a physiologic pressure with balanced salt solution.  No wound leaks were noted. Moxifloxacin was injected intracamerally.  Timolol and Brimonidine drops were applied to the eye.  The patient was taken to the recovery room in stable condition without complications of anesthesia or surgery.  [employer] 10/12/2024, 11:20 AM

## 2024-10-13 ENCOUNTER — Other Ambulatory Visit: Payer: Self-pay | Admitting: Nurse Practitioner

## 2024-10-13 DIAGNOSIS — Z1211 Encounter for screening for malignant neoplasm of colon: Secondary | ICD-10-CM

## 2024-10-13 NOTE — Telephone Encounter (Signed)
 Colonoscopy ordered. See mychart message.

## 2024-10-17 ENCOUNTER — Telehealth: Payer: Self-pay

## 2024-10-17 ENCOUNTER — Other Ambulatory Visit: Payer: Self-pay

## 2024-10-17 DIAGNOSIS — Z860100 Personal history of colon polyps, unspecified: Secondary | ICD-10-CM

## 2024-10-17 MED ORDER — NA SULFATE-K SULFATE-MG SULF 17.5-3.13-1.6 GM/177ML PO SOLN: 1.0000 | Freq: Once | ORAL | 0 refills | Status: AC

## 2024-10-17 NOTE — Telephone Encounter (Signed)
 Gastroenterology Pre-Procedure Review  Request Date: 12/11/24 Requesting Physician: Dr. Melany  PATIENT REVIEW QUESTIONS: The patient responded to the following health history questions as indicated:    1. Are you having any GI issues? no 2. Do you have a personal history of Polyps? yes (last colonoscopy performed by Dr. Therisa 10/16/16 recommended repeat in 5 years) 3. Do you have a family history of Colon Cancer or Polyps? no 4. Diabetes Mellitus? no 5. Joint replacements in the past 12 months?no 6. Major health problems in the past 3 months?no 7. Any artificial heart valves, MVP, or defibrillator?no    MEDICATIONS & ALLERGIES:    Patient reports the following regarding taking any anticoagulation/antiplatelet therapy:   Plavix, Coumadin, Eliquis, Xarelto, Lovenox, Pradaxa, Brilinta, or Effient? no Aspirin? yes (81 mg daily)  Patient confirms/reports the following medications:  Current Outpatient Medications  Medication Sig Dispense Refill   acetic acid -hydrocortisone  (VOSOL -HC) OTIC solution Place 3 drops into both ears 2 (two) times daily for 7 days. 2.1 mL 0   ASPIRIN 81 PO Take by mouth daily.     atorvastatin  (LIPITOR) 20 MG tablet Take 1 tablet (20 mg total) by mouth at bedtime. 90 tablet 1   dorzolamide-timolol  (COSOPT) 2-0.5 % ophthalmic solution Place 1 drop into the right eye 2 (two) times daily.     Multiple Vitamins tablet Take by mouth.     Omega-3 Fatty Acids (FISH OIL BURP-LESS PO) Take by mouth.     valsartan  (DIOVAN ) 40 MG tablet Take 1 tablet (40 mg total) by mouth daily. 90 tablet 1   No current facility-administered medications for this visit.    Patient confirms/reports the following allergies:  Allergies[1]  No orders of the defined types were placed in this encounter.   AUTHORIZATION INFORMATION Primary Insurance: 1D#: Group #:  Secondary Insurance: 1D#: Group #:  SCHEDULE INFORMATION: Date: 12/11/24 Time: Location: [facility]    [1] No Known  Allergies

## 2024-10-19 ENCOUNTER — Encounter: Payer: Self-pay | Admitting: Pediatrics

## 2024-12-05 ENCOUNTER — Other Ambulatory Visit: Payer: Self-pay

## 2024-12-05 MED ORDER — NA SULFATE-K SULFATE-MG SULF 17.5-3.13-1.6 GM/177ML PO SOLN: 1.0000 | Freq: Once | ORAL | 0 refills | Status: AC

## 2024-12-07 ENCOUNTER — Encounter: Payer: Self-pay | Admitting: Gastroenterology

## 2024-12-11 ENCOUNTER — Ambulatory Visit: Admission: RE | Admit: 2024-12-11 | Source: Home / Self Care | Admitting: Gastroenterology

## 2024-12-11 ENCOUNTER — Encounter: Admission: RE | Payer: Self-pay | Source: Home / Self Care

## 2024-12-11 HISTORY — DX: Unspecified osteoarthritis, unspecified site: M19.90

## 2024-12-11 HISTORY — DX: Fatty (change of) liver, not elsewhere classified: K76.0

## 2025-04-16 ENCOUNTER — Encounter: Admitting: Nurse Practitioner
